# Patient Record
Sex: Female | Born: 1945 | Race: White | Hispanic: No | Smoking: Former smoker
Health system: Southern US, Community
[De-identification: ages and names within clinical notes are randomized; demographics above are authoritative.]

## PROBLEM LIST (undated history)

## (undated) DIAGNOSIS — J309 Allergic rhinitis, unspecified: Secondary | ICD-10-CM

## (undated) DIAGNOSIS — K219 Gastro-esophageal reflux disease without esophagitis: Secondary | ICD-10-CM

## (undated) DIAGNOSIS — K449 Diaphragmatic hernia without obstruction or gangrene: Secondary | ICD-10-CM

## (undated) DIAGNOSIS — H269 Unspecified cataract: Secondary | ICD-10-CM

## (undated) DIAGNOSIS — E785 Hyperlipidemia, unspecified: Secondary | ICD-10-CM

## (undated) DIAGNOSIS — M5030 Other cervical disc degeneration, unspecified cervical region: Secondary | ICD-10-CM

## (undated) HISTORY — DX: Allergic rhinitis, unspecified: J30.9

## (undated) HISTORY — PX: ESOPHAGOGASTRODUODENOSCOPY: SHX1529

## (undated) HISTORY — DX: Hyperlipidemia, unspecified: E78.5

## (undated) HISTORY — PX: OTHER SURGICAL HISTORY: SHX169

## (undated) HISTORY — DX: Other cervical disc degeneration, unspecified cervical region: M50.30

## (undated) HISTORY — DX: Gastro-esophageal reflux disease without esophagitis: K21.9

## (undated) HISTORY — PX: ABDOMINAL HYSTERECTOMY: SHX81

## (undated) HISTORY — DX: Diaphragmatic hernia without obstruction or gangrene: K44.9

## (undated) HISTORY — PX: SHOULDER SURGERY: SHX246

## (undated) HISTORY — DX: Unspecified cataract: H26.9

---

## 2000-02-17 ENCOUNTER — Ambulatory Visit (HOSPITAL_COMMUNITY): Admission: RE | Admit: 2000-02-17 | Discharge: 2000-02-17 | Payer: Self-pay | Admitting: *Deleted

## 2000-02-17 ENCOUNTER — Encounter: Payer: Self-pay | Admitting: *Deleted

## 2000-05-08 ENCOUNTER — Ambulatory Visit (HOSPITAL_COMMUNITY): Admission: RE | Admit: 2000-05-08 | Discharge: 2000-05-08 | Payer: Self-pay | Admitting: *Deleted

## 2001-08-15 ENCOUNTER — Other Ambulatory Visit: Admission: RE | Admit: 2001-08-15 | Discharge: 2001-08-15 | Payer: Self-pay | Admitting: Obstetrics and Gynecology

## 2002-07-30 ENCOUNTER — Encounter: Payer: Self-pay | Admitting: Internal Medicine

## 2002-07-30 ENCOUNTER — Encounter: Admission: RE | Admit: 2002-07-30 | Discharge: 2002-07-30 | Payer: Self-pay | Admitting: Internal Medicine

## 2005-11-23 ENCOUNTER — Ambulatory Visit: Payer: Self-pay | Admitting: Internal Medicine

## 2005-11-28 HISTORY — PX: COLONOSCOPY: SHX174

## 2005-12-02 ENCOUNTER — Ambulatory Visit: Payer: Self-pay | Admitting: Internal Medicine

## 2011-12-14 DIAGNOSIS — J301 Allergic rhinitis due to pollen: Secondary | ICD-10-CM | POA: Diagnosis not present

## 2011-12-14 DIAGNOSIS — Z23 Encounter for immunization: Secondary | ICD-10-CM | POA: Diagnosis not present

## 2011-12-14 DIAGNOSIS — E785 Hyperlipidemia, unspecified: Secondary | ICD-10-CM | POA: Diagnosis not present

## 2011-12-14 DIAGNOSIS — K649 Unspecified hemorrhoids: Secondary | ICD-10-CM | POA: Diagnosis not present

## 2011-12-14 DIAGNOSIS — K219 Gastro-esophageal reflux disease without esophagitis: Secondary | ICD-10-CM | POA: Diagnosis not present

## 2011-12-15 DIAGNOSIS — E785 Hyperlipidemia, unspecified: Secondary | ICD-10-CM | POA: Diagnosis not present

## 2011-12-26 DIAGNOSIS — R928 Other abnormal and inconclusive findings on diagnostic imaging of breast: Secondary | ICD-10-CM | POA: Diagnosis not present

## 2012-01-02 ENCOUNTER — Other Ambulatory Visit: Payer: Self-pay

## 2012-01-02 DIAGNOSIS — R928 Other abnormal and inconclusive findings on diagnostic imaging of breast: Secondary | ICD-10-CM | POA: Diagnosis not present

## 2012-01-02 DIAGNOSIS — D249 Benign neoplasm of unspecified breast: Secondary | ICD-10-CM | POA: Diagnosis not present

## 2012-01-02 DIAGNOSIS — Z0189 Encounter for other specified special examinations: Secondary | ICD-10-CM | POA: Diagnosis not present

## 2012-04-02 DIAGNOSIS — D3130 Benign neoplasm of unspecified choroid: Secondary | ICD-10-CM | POA: Diagnosis not present

## 2012-04-02 DIAGNOSIS — H5010 Unspecified exotropia: Secondary | ICD-10-CM | POA: Diagnosis not present

## 2012-07-02 DIAGNOSIS — D249 Benign neoplasm of unspecified breast: Secondary | ICD-10-CM | POA: Diagnosis not present

## 2012-07-02 DIAGNOSIS — Z09 Encounter for follow-up examination after completed treatment for conditions other than malignant neoplasm: Secondary | ICD-10-CM | POA: Diagnosis not present

## 2012-09-10 DIAGNOSIS — Z23 Encounter for immunization: Secondary | ICD-10-CM | POA: Diagnosis not present

## 2012-12-31 DIAGNOSIS — Z1231 Encounter for screening mammogram for malignant neoplasm of breast: Secondary | ICD-10-CM | POA: Diagnosis not present

## 2013-01-22 DIAGNOSIS — E785 Hyperlipidemia, unspecified: Secondary | ICD-10-CM | POA: Diagnosis not present

## 2013-04-03 DIAGNOSIS — H2510 Age-related nuclear cataract, unspecified eye: Secondary | ICD-10-CM | POA: Diagnosis not present

## 2013-04-03 DIAGNOSIS — D3130 Benign neoplasm of unspecified choroid: Secondary | ICD-10-CM | POA: Diagnosis not present

## 2013-09-03 DIAGNOSIS — Z23 Encounter for immunization: Secondary | ICD-10-CM | POA: Diagnosis not present

## 2014-01-02 DIAGNOSIS — Z1231 Encounter for screening mammogram for malignant neoplasm of breast: Secondary | ICD-10-CM | POA: Diagnosis not present

## 2014-02-03 DIAGNOSIS — K219 Gastro-esophageal reflux disease without esophagitis: Secondary | ICD-10-CM | POA: Diagnosis not present

## 2014-02-03 DIAGNOSIS — IMO0002 Reserved for concepts with insufficient information to code with codable children: Secondary | ICD-10-CM | POA: Diagnosis not present

## 2014-02-03 DIAGNOSIS — E785 Hyperlipidemia, unspecified: Secondary | ICD-10-CM | POA: Diagnosis not present

## 2014-02-03 DIAGNOSIS — M1990 Unspecified osteoarthritis, unspecified site: Secondary | ICD-10-CM | POA: Diagnosis not present

## 2014-02-03 DIAGNOSIS — J301 Allergic rhinitis due to pollen: Secondary | ICD-10-CM | POA: Diagnosis not present

## 2014-02-03 DIAGNOSIS — Z1331 Encounter for screening for depression: Secondary | ICD-10-CM | POA: Diagnosis not present

## 2014-03-31 DIAGNOSIS — H5052 Exophoria: Secondary | ICD-10-CM | POA: Diagnosis not present

## 2014-03-31 DIAGNOSIS — D3130 Benign neoplasm of unspecified choroid: Secondary | ICD-10-CM | POA: Diagnosis not present

## 2014-03-31 DIAGNOSIS — H2589 Other age-related cataract: Secondary | ICD-10-CM | POA: Diagnosis not present

## 2014-06-13 DIAGNOSIS — M79609 Pain in unspecified limb: Secondary | ICD-10-CM | POA: Diagnosis not present

## 2014-07-02 ENCOUNTER — Ambulatory Visit (INDEPENDENT_AMBULATORY_CARE_PROVIDER_SITE_OTHER): Payer: Medicare Other | Admitting: Podiatrist

## 2014-07-02 ENCOUNTER — Encounter: Payer: Self-pay | Admitting: Podiatrist

## 2014-07-02 ENCOUNTER — Ambulatory Visit (INDEPENDENT_AMBULATORY_CARE_PROVIDER_SITE_OTHER): Payer: Medicare Other

## 2014-07-02 VITALS — BP 154/85 | HR 75 | Resp 18

## 2014-07-02 DIAGNOSIS — S93609A Unspecified sprain of unspecified foot, initial encounter: Secondary | ICD-10-CM | POA: Diagnosis not present

## 2014-07-02 DIAGNOSIS — R52 Pain, unspecified: Secondary | ICD-10-CM

## 2014-07-02 NOTE — Progress Notes (Signed)
   Subjective:    Patient ID: Andrea Carroll, female    DOB: December 03, 1945, 68 y.o.   MRN: [id number]  HPI MY LEFT FOOT HAS BEEN GOING ON FOR ABOUT A MONTH AND HURTS ON THE SIDE AND A KNOT AND SHOOTING PAINS AND BURNS AND THROBS AND HURTS MORE AT NIGHT    Review of Systems  All other systems reviewed and are negative.      Objective:   Physical Exam  Patient is awake, alert, and oriented x 3.  In no acute distress.  Vascular status is intact with palpable pedal pulses at 2/4 DP and PT bilateral and capillary refill time within normal limits. Neurological sensation is also intact bilaterally via Semmes Weinstein monofilament at 5/5 sites. Light touch, vibratory sensation, Achilles tendon reflex is intact. Dermatological exam reveals skin color, turger and texture as normal. No open lesions present.  Musculature intact with dorsiflexion, plantarflexion, inversion, eversion.  Mild discomfort on the lateral aspect the left foot is noted. At the fifth metatarsal base. No pain along the peroneal tendons is present. X-rays are negative for fracture.     Assessment & Plan:  Sprain left foot  Plan: Recommended rest, ice, compression. Discussed putting her in a boot should the discomfort not resolve with compression wraps. The information on foot sprains was dispensed.  If her foot continues to give her trouble she will call and I will dispense a boot. Otherwise she'll be seen back as needed.

## 2014-07-02 NOTE — Patient Instructions (Signed)
Foot Sprain The muscles and cord like structures which attach muscle to bone (tendons) that surround the feet are made up of units. A foot sprain can occur at the weakest spot in any of these units. This condition is most often caused by injury to or overuse of the foot, as from playing contact sports, or aggravating a previous injury, or from poor conditioning, or obesity. SYMPTOMS  Pain with movement of the foot.  Tenderness and swelling at the injury site.  Loss of strength is present in moderate or severe sprains. THE THREE GRADES OR SEVERITY OF FOOT SPRAIN ARE:  Mild (Grade I): Slightly pulled muscle without tearing of muscle or tendon fibers or loss of strength.  Moderate (Grade II): Tearing of fibers in a muscle, tendon, or at the attachment to bone, with small decrease in strength.  Severe (Grade III): Rupture of the muscle-tendon-bone attachment, with separation of fibers. Severe sprain requires surgical repair. Often repeating (chronic) sprains are caused by overuse. Sudden (acute) sprains are caused by direct injury or over-use. DIAGNOSIS  Diagnosis of this condition is usually by your own observation. If problems continue, a caregiver may be required for further evaluation and treatment. X-rays may be required to make sure there are not breaks in the bones (fractures) present. Continued problems may require physical therapy for treatment. PREVENTION  Use strength and conditioning exercises appropriate for your sport.  Warm up properly prior to working out.  Use athletic shoes that are made for the sport you are participating in.  Allow adequate time for healing. Early return to activities makes repeat injury more likely, and can lead to an unstable arthritic foot that can result in prolonged disability. Mild sprains generally heal in 3 to 10 days, with moderate and severe sprains taking 2 to 10 weeks. Your caregiver can help you determine the proper time required for  healing. HOME CARE INSTRUCTIONS   Apply ice to the injury for 15-20 minutes, 03-04 times per day. Put the ice in a plastic bag and place a towel between the bag of ice and your skin.  An elastic wrap (like an Ace bandage) may be used to keep swelling down.  Keep foot above the level of the heart, or at least raised on a footstool, when swelling and pain are present.  Try to avoid use other than gentle range of motion while the foot is painful. Do not resume use until instructed by your caregiver. Then begin use gradually, not increasing use to the point of pain. If pain does develop, decrease use and continue the above measures, gradually increasing activities that do not cause discomfort, until you gradually achieve normal use.  Use crutches if and as instructed, and for the length of time instructed.  Keep injured foot and ankle wrapped between treatments.  Massage foot and ankle for comfort and to keep swelling down. Massage from the toes up towards the knee.  Only take over-the-counter or prescription medicines for pain, discomfort, or fever as directed by your caregiver. SEEK IMMEDIATE MEDICAL CARE IF:   Your pain and swelling increase, or pain is not controlled with medications.  You have loss of feeling in your foot or your foot turns cold or blue.  You develop new, unexplained symptoms, or an increase of the symptoms that brought you to your caregiver. MAKE SURE YOU:   Understand these instructions.  Will watch your condition.  Will get help right away if you are not doing well or get worse. Document Released:   05/06/2002 Document Revised: 02/06/2012 Document Reviewed: 07/03/2008 Exit[employer] Patient Information 2015 [employer]. This information is not intended to replace advice given to you by your health care provider. Make sure you discuss any questions you have with your health care provider.  

## 2014-09-01 DIAGNOSIS — Z23 Encounter for immunization: Secondary | ICD-10-CM | POA: Diagnosis not present

## 2014-09-24 ENCOUNTER — Encounter: Payer: Self-pay | Admitting: Podiatrist

## 2014-09-24 ENCOUNTER — Ambulatory Visit (INDEPENDENT_AMBULATORY_CARE_PROVIDER_SITE_OTHER): Payer: Medicare Other | Admitting: Podiatrist

## 2014-09-24 VITALS — BP 139/64 | HR 87 | Resp 12

## 2014-09-24 DIAGNOSIS — M6588 Other synovitis and tenosynovitis, other site: Secondary | ICD-10-CM | POA: Diagnosis not present

## 2014-09-24 DIAGNOSIS — M71572 Other bursitis, not elsewhere classified, left ankle and foot: Secondary | ICD-10-CM | POA: Diagnosis not present

## 2014-09-24 DIAGNOSIS — M7750 Other enthesopathy of unspecified foot: Secondary | ICD-10-CM

## 2014-09-24 DIAGNOSIS — M7752 Other enthesopathy of left foot: Secondary | ICD-10-CM

## 2014-09-24 MED ORDER — TRIAMCINOLONE ACETONIDE 10 MG/ML IJ SUSP
10.0000 mg | Freq: Once | INTRAMUSCULAR | Status: AC
  Administered 2014-09-24: 10 mg

## 2014-09-26 NOTE — Progress Notes (Signed)
Chief Complaint  Patient presents with  . Foot Pain    ''LT OUTSIDE OF THE FOOT IS ACHING AND THROBBING.''     HPI: Patient is 68 y.o. female who presents today for pain on the lateral side of the left foot at the fifth metatarsal base region. She states that the pain goes under her foot and onto the medial aspect of the foot plantarly. Any trauma or injury to the foot.  Physical Exam  Patient is awake, alert, and oriented x 3.  In no acute distress.  Vascular status is intact with palpable pedal pulses at 2/4 DP and PT bilateral and capillary refill time within normal limits. Neurological sensation is also intact bilaterally via Semmes Weinstein monofilament at 5/5 sites. Light touch, vibratory sensation, Achilles tendon reflex is intact. Dermatological exam reveals skin color, turger and texture as normal. No open lesions present.  Musculature intact with dorsiflexion, plantarflexion, inversion, eversion. Pain along the lateral aspect of the foot at the fifth metatarsal base and plantarly along the course of the peroneus longus tendon is noted. Proximal pain along the course of the peroneus longus tendon is also noted. Swelling at the base of the fifth metatarsal is also noted.  Assessment: Bursitis, tendinitis left foot  Plan: Recommended an injection into the area of maximal tenderness and the patient agreed. Dexamethasone and Marcaine plain were infiltrated into the area of maximal tenderness. A plantar fascial strapping was then applied and she was given instructions for decreasing activity and for aftercare. Discussed the long-term positive effects of orthotics. I will see her back for recheck and we'll discuss further.

## 2014-10-21 DIAGNOSIS — H18822 Corneal disorder due to contact lens, left eye: Secondary | ICD-10-CM | POA: Diagnosis not present

## 2015-01-05 ENCOUNTER — Emergency Department (HOSPITAL_COMMUNITY): Payer: Medicare Other

## 2015-01-05 ENCOUNTER — Inpatient Hospital Stay (HOSPITAL_COMMUNITY)
Admission: EM | Admit: 2015-01-05 | Discharge: 2015-01-09 | DRG: 418 | Disposition: A | Discharge status: Home / Self Care | Payer: Medicare Other | Attending: Surgery | Admitting: Surgery

## 2015-01-05 ENCOUNTER — Encounter (HOSPITAL_COMMUNITY): Payer: Self-pay | Admitting: Family Medicine

## 2015-01-05 DIAGNOSIS — K219 Gastro-esophageal reflux disease without esophagitis: Secondary | ICD-10-CM | POA: Diagnosis not present

## 2015-01-05 DIAGNOSIS — E876 Hypokalemia: Secondary | ICD-10-CM | POA: Diagnosis not present

## 2015-01-05 DIAGNOSIS — K828 Other specified diseases of gallbladder: Secondary | ICD-10-CM | POA: Diagnosis not present

## 2015-01-05 DIAGNOSIS — K8000 Calculus of gallbladder with acute cholecystitis without obstruction: Secondary | ICD-10-CM | POA: Diagnosis present

## 2015-01-05 DIAGNOSIS — K8020 Calculus of gallbladder without cholecystitis without obstruction: Secondary | ICD-10-CM | POA: Diagnosis not present

## 2015-01-05 DIAGNOSIS — Z90710 Acquired absence of both cervix and uterus: Secondary | ICD-10-CM

## 2015-01-05 DIAGNOSIS — R05 Cough: Secondary | ICD-10-CM

## 2015-01-05 DIAGNOSIS — K8010 Calculus of gallbladder with chronic cholecystitis without obstruction: Secondary | ICD-10-CM | POA: Diagnosis present

## 2015-01-05 DIAGNOSIS — R7989 Other specified abnormal findings of blood chemistry: Secondary | ICD-10-CM | POA: Diagnosis not present

## 2015-01-05 DIAGNOSIS — Z882 Allergy status to sulfonamides status: Secondary | ICD-10-CM | POA: Diagnosis not present

## 2015-01-05 DIAGNOSIS — K8060 Calculus of gallbladder and bile duct with cholecystitis, unspecified, without obstruction: Secondary | ICD-10-CM | POA: Diagnosis not present

## 2015-01-05 DIAGNOSIS — R945 Abnormal results of liver function studies: Secondary | ICD-10-CM

## 2015-01-05 DIAGNOSIS — R10811 Right upper quadrant abdominal tenderness: Secondary | ICD-10-CM | POA: Diagnosis not present

## 2015-01-05 DIAGNOSIS — R059 Cough, unspecified: Secondary | ICD-10-CM

## 2015-01-05 DIAGNOSIS — R202 Paresthesia of skin: Secondary | ICD-10-CM | POA: Diagnosis not present

## 2015-01-05 DIAGNOSIS — K810 Acute cholecystitis: Secondary | ICD-10-CM

## 2015-01-05 DIAGNOSIS — N390 Urinary tract infection, site not specified: Secondary | ICD-10-CM | POA: Diagnosis present

## 2015-01-05 DIAGNOSIS — Z7982 Long term (current) use of aspirin: Secondary | ICD-10-CM

## 2015-01-05 DIAGNOSIS — F419 Anxiety disorder, unspecified: Secondary | ICD-10-CM | POA: Diagnosis present

## 2015-01-05 DIAGNOSIS — I6789 Other cerebrovascular disease: Secondary | ICD-10-CM | POA: Diagnosis not present

## 2015-01-05 DIAGNOSIS — R509 Fever, unspecified: Secondary | ICD-10-CM | POA: Diagnosis not present

## 2015-01-05 DIAGNOSIS — R1011 Right upper quadrant pain: Secondary | ICD-10-CM | POA: Diagnosis not present

## 2015-01-05 LAB — COMPREHENSIVE METABOLIC PANEL
ALT: 73 U/L — AB (ref 0–35)
ANION GAP: 11 (ref 5–15)
AST: 63 U/L — ABNORMAL HIGH (ref 0–37)
Albumin: 2.6 g/dL — ABNORMAL LOW (ref 3.5–5.2)
Alkaline Phosphatase: 304 U/L — ABNORMAL HIGH (ref 39–117)
BUN: 9 mg/dL (ref 6–23)
CHLORIDE: 103 mmol/L (ref 96–112)
CO2: 27 mmol/L (ref 19–32)
Calcium: 8.7 mg/dL (ref 8.4–10.5)
Creatinine, Ser: 0.64 mg/dL (ref 0.50–1.10)
GFR calc Af Amer: >90 mL/min (ref >90)
GFR calc non Af Amer: 88 mL/min — ABNORMAL LOW (ref >90)
Glucose, Bld: 108 mg/dL — ABNORMAL HIGH (ref 70–99)
Potassium: 2.9 mmol/L — ABNORMAL LOW (ref 3.5–5.1)
SODIUM: 141 mmol/L (ref 135–145)
Total Bilirubin: 1.5 mg/dL — ABNORMAL HIGH (ref 0.3–1.2)
Total Protein: 6.6 g/dL (ref 6.0–8.3)

## 2015-01-05 LAB — CBC WITH DIFFERENTIAL/PLATELET
BASOS ABS: 0 10*3/uL (ref 0.0–0.1)
Basophils Relative: 0 % (ref 0–1)
EOS PCT: 1 % (ref 0–5)
Eosinophils Absolute: 0 10*3/uL (ref 0.0–0.7)
HEMATOCRIT: 34.7 % — AB (ref 36.0–46.0)
HEMOGLOBIN: 11.6 g/dL — AB (ref 12.0–15.0)
Lymphocytes Relative: 16 % (ref 12–46)
Lymphs Abs: 1.3 10*3/uL (ref 0.7–4.0)
MCH: 29.2 pg (ref 26.0–34.0)
MCHC: 33.4 g/dL (ref 30.0–36.0)
MCV: 87.4 fL (ref 78.0–100.0)
MONO ABS: 1.1 10*3/uL — AB (ref 0.1–1.0)
Monocytes Relative: 13 % — ABNORMAL HIGH (ref 3–12)
NEUTROS ABS: 5.5 10*3/uL (ref 1.7–7.7)
NEUTROS PCT: 70 % (ref 43–77)
PLATELETS: 257 10*3/uL (ref 150–400)
RBC: 3.97 MIL/uL (ref 3.87–5.11)
RDW: 13.6 % (ref 11.5–15.5)
WBC: 7.9 10*3/uL (ref 4.0–10.5)

## 2015-01-05 LAB — URINE MICROSCOPIC-ADD ON

## 2015-01-05 LAB — URINALYSIS, ROUTINE W REFLEX MICROSCOPIC
Bilirubin Urine: NEGATIVE
GLUCOSE, UA: NEGATIVE mg/dL
Ketones, ur: 15 mg/dL — AB
Nitrite: NEGATIVE
PH: 7 (ref 5.0–8.0)
Protein, ur: NEGATIVE mg/dL
SPECIFIC GRAVITY, URINE: 1.006 (ref 1.005–1.030)
Urobilinogen, UA: 1 mg/dL (ref 0.0–1.0)

## 2015-01-05 LAB — LIPASE, BLOOD: Lipase: 23 U/L (ref 11–59)

## 2015-01-05 MED ORDER — MORPHINE SULFATE 2 MG/ML IJ SOLN
2.0000 mg | INTRAMUSCULAR | Status: DC | PRN
  Administered 2015-01-06: 4 mg via INTRAVENOUS
  Administered 2015-01-06: 2 mg via INTRAVENOUS
  Filled 2015-01-05: qty 1
  Filled 2015-01-05: qty 2

## 2015-01-05 MED ORDER — ACETAMINOPHEN 325 MG PO TABS
650.0000 mg | ORAL_TABLET | Freq: Four times a day (QID) | ORAL | Status: DC | PRN
  Administered 2015-01-07: 650 mg via ORAL
  Filled 2015-01-05: qty 2

## 2015-01-05 MED ORDER — CEFTRIAXONE SODIUM 1 G IJ SOLR
1.0000 g | Freq: Once | INTRAMUSCULAR | Status: AC
  Administered 2015-01-05: 1 g via INTRAVENOUS
  Filled 2015-01-05: qty 10

## 2015-01-05 MED ORDER — WHITE PETROLATUM GEL
Status: AC
Start: 2015-01-05 — End: 2015-01-06
  Filled 2015-01-05: qty 1

## 2015-01-05 MED ORDER — SODIUM CHLORIDE 0.9 % IV BOLUS (SEPSIS)
1000.0000 mL | Freq: Once | INTRAVENOUS | Status: AC
  Administered 2015-01-05: 1000 mL via INTRAVENOUS

## 2015-01-05 MED ORDER — PANTOPRAZOLE SODIUM 40 MG IV SOLR
40.0000 mg | Freq: Every day | INTRAVENOUS | Status: DC
  Administered 2015-01-05 – 2015-01-07 (×3): 40 mg via INTRAVENOUS
  Filled 2015-01-05 (×3): qty 40

## 2015-01-05 MED ORDER — ACETAMINOPHEN 650 MG RE SUPP: 650.0000 mg | Freq: Four times a day (QID) | RECTAL | Status: DC | PRN

## 2015-01-05 MED ORDER — KCL IN DEXTROSE-NACL 20-5-0.45 MEQ/L-%-% IV SOLN
INTRAVENOUS | Status: DC
  Administered 2015-01-05 – 2015-01-07 (×3): via INTRAVENOUS
  Filled 2015-01-05 (×8): qty 1000

## 2015-01-05 MED ORDER — ONDANSETRON HCL 4 MG/2ML IJ SOLN: 4.0000 mg | Freq: Four times a day (QID) | INTRAMUSCULAR | Status: DC | PRN

## 2015-01-05 MED ORDER — POTASSIUM CHLORIDE 10 MEQ/100ML IV SOLN
10.0000 meq | Freq: Once | INTRAVENOUS | Status: AC
  Administered 2015-01-05: 10 meq via INTRAVENOUS
  Filled 2015-01-05: qty 100

## 2015-01-05 MED ORDER — CEFTRIAXONE SODIUM IN DEXTROSE 40 MG/ML IV SOLN
2.0000 g | INTRAVENOUS | Status: DC
  Administered 2015-01-06: 2 g via INTRAVENOUS
  Filled 2015-01-05 (×2): qty 50

## 2015-01-05 NOTE — ED Notes (Signed)
Pt here for chills,non productive cough and anxiety attack.

## 2015-01-05 NOTE — ED Provider Notes (Signed)
CSN: [id number]     Arrival date & time 01/05/15  1127 History   First MD Initiated Contact with Patient 01/05/15 1609     Chief Complaint  Patient presents with  . Anxiety  . Chills  . Fever     (Consider location/radiation/quality/duration/timing/severity/associated sxs/prior Treatment) The history is provided by the patient.     Patient presents with one week of shaking chills, fever to 101.9, headache, ear pain, nausea and dry heaves, anorexia, occasional SOB and pain under her ribs with deep inspiration.  Was prescribed Tamiflu over the phone by her PCP Dr Dagmar Hait.  Felt she was intermittently getting better, then worse.  Today felt "rubbery" and weak all over, developed numbness in her ears and hands with SOB - told by fire department she was having a panic attack.  Denies cough, sore throat, vomiting, diarrhea, nasal congestion/ rhinorrhea, urinary symptoms, abdominal pain.   History reviewed. No pertinent past medical history. Past Surgical History  Procedure Laterality Date  . Biospy surgery      breast   History reviewed. No pertinent family history. History  Substance Use Topics  . Smoking status: Never Smoker   . Smokeless tobacco: Never Used  . Alcohol Use: Not on file   OB History    No data available     Review of Systems  All other systems reviewed and are negative.     Allergies  Sulfa antibiotics  Home Medications   Prior to Admission medications   Medication Sig Start Date End Date Taking? Authorizing Provider  aspirin 81 MG tablet Take 81 mg by mouth daily.    Historical Provider, MD  calcium carbonate (OS-CAL) 600 MG TABS tablet Take 600 mg by mouth 2 (two) times daily with a meal.    Historical Provider, MD  DETROL LA 4 MG 24 hr capsule  04/23/14   Historical Provider, MD  Glucosamine-Chondroit-Vit C-Mn (GLUCOSAMINE 1500 COMPLEX) CAPS Take by mouth.    Historical Provider, MD  lansoprazole (PREVACID) 30 MG capsule  05/31/14   Historical Provider, MD   Multiple Vitamin (MULTI VITAMIN DAILY PO) Take by mouth.    Historical Provider, MD  Omega-3 Fatty Acids (FISH OIL) 1000 MG CAPS Take by mouth.    Historical Provider, MD  Vitamin D, Ergocalciferol, (DRISDOL) 50000 UNITS CAPS capsule Take 50,000 Units by mouth every 7 (seven) days.    Historical Provider, MD   BP 135/71 mmHg  Pulse 94  Temp(Src) 97.7 F (36.5 C) (Oral)  Resp 18  Wt 144 lb (65.318 kg)  SpO2 96% Physical Exam  Constitutional: She appears well-developed and well-nourished. No distress.  HENT:  Head: Normocephalic and atraumatic.  Neck: Neck supple.  Cardiovascular: Normal rate and regular rhythm.   Pulmonary/Chest: Effort normal and breath sounds normal. No respiratory distress. She has no wheezes. She has no rales.  Abdominal: Soft. She exhibits no distension. There is tenderness in the right upper quadrant. There is no rebound and no guarding.  Neurological: She is alert.  Skin: She is not diaphoretic.  Nursing note and vitals reviewed.   ED Course  Procedures (including critical care time) Labs Review Labs Reviewed  CBC WITH DIFFERENTIAL/PLATELET - Abnormal; Notable for the following:    Hemoglobin 11.6 (*)    HCT 34.7 (*)    Monocytes Relative 13 (*)    Monocytes Absolute 1.1 (*)    All other components within normal limits  COMPREHENSIVE METABOLIC PANEL - Abnormal; Notable for the following:    Potassium  2.9 (*)    Glucose, Bld 108 (*)    Albumin 2.6 (*)    AST 63 (*)    ALT 73 (*)    Alkaline Phosphatase 304 (*)    Total Bilirubin 1.5 (*)    GFR calc non Af Amer 88 (*)    All other components within normal limits  URINALYSIS, ROUTINE W REFLEX MICROSCOPIC - Abnormal; Notable for the following:    APPearance CLOUDY (*)    Hgb urine dipstick SMALL (*)    Ketones, ur 15 (*)    Leukocytes, UA LARGE (*)    All other components within normal limits  URINE MICROSCOPIC-ADD ON - Abnormal; Notable for the following:    Squamous Epithelial / LPF FEW (*)     Bacteria, UA MANY (*)    All other components within normal limits  URINE CULTURE  LIPASE, BLOOD    Imaging Review Dg Chest 2 View  01/05/2015   CLINICAL DATA:  Fever and cough.  EXAM: CHEST  2 VIEW  COMPARISON:  None.  FINDINGS: Heart size and mediastinal contours are within normal limits. Both lungs are clear. Visualized skeletal structures are unremarkable.  IMPRESSION: Negative exam.   Electronically Signed   By: Inge Rise M.D.   On: 01/05/2015 12:55   US Abdomen Complete  01/05/2015   CLINICAL DATA:  Right upper quadrant tenderness and elevated liver function tests.  EXAM: ULTRASOUND ABDOMEN COMPLETE  COMPARISON:  None.  FINDINGS: Gallbladder: Cholelithiasis with large stone in the gallbladder neck measuring about 2.1 cm diameter an additional smaller stones and sludge in the remainder of the gallbladder. Diffuse gallbladder wall thickening measuring up to 4.5 mm. Murphy's sign is negative.  Common bile duct: Diameter: 4.5 mm, normal  Liver: No focal lesion identified. Within normal limits in parenchymal echogenicity.  IVC: No abnormality visualized.  Pancreas: Visualized portion unremarkable.  Spleen: Size and appearance within normal limits.  Right Kidney: Length: 10.4 cm. Echogenicity within normal limits. No mass or hydronephrosis visualized.  Left Kidney: Length: 11.1 cm. Echogenicity within normal limits. No mass or hydronephrosis visualized.  Abdominal aorta: No aneurysm visualized.  Other findings: None.  IMPRESSION: Abnormal gallbladder with gallbladder wall thickening, cholelithiasis, and sludge. Murphy's sign is negative. Findings are nonspecific but may indicate acute or chronic cholecystitis. No bile duct dilatation.   Electronically Signed   By: Lucienne Capers M.D.   On: 01/05/2015 18:49     EKG Interpretation None       6:01 PM Discussed pt with Dr Ralene Bathe.   MDM   Final diagnoses:  RUQ abdominal tenderness  LFT elevation  Acute cholecystitis  UTI (lower urinary  tract infection)    Afebrile nontoxic patient with one week of fever/chills, myalgias, found to have UTI, culture pending, rocephin given, and elevated LFTs.  [place] abd shows abnormal gallbladder with wall thickening, cholelithiasis with large stone in gallbladder neck.   CXR negative.  Labs otherwise remarkable for anemia.  Discussed pt with Dr Grandville Silos, general surgery, who has seen the patient in the ED and admitted to the hospital for surgery.      Clayton Bibles, PA-C 01/05/15 2232  Quintella Reichert, MD 01/06/15 [phone]

## 2015-01-05 NOTE — Progress Notes (Signed)
Tct Emergency department  to get report/ nurse will call me back

## 2015-01-05 NOTE — H&P (Signed)
Andrea Carroll is an 69 y.o. female.   Chief Complaint: Fever, malaise, right upper quadrant pain HPI: Andrea Carroll complains of a 7 day history of malaise. She had subjective fevers at home. She thought she had the flu and was given a prescription for Tamiflu by her primary care physician. She had a reaction to that medication which included tachycardia so she stopped taking it. She continued to feel poorly and had an appointment for later today, however, she acutely felt worse and came via EMS to the emergency department. Evaluation here included laboratory studies showing likely urinary tract infection, hypokalemia, and elevated liver function tests. Abdominal ultrasound demonstrates cholelithiasis with a stone in the neck of the gallbladder and some gallbladder wall thickening. I was asked to evaluate her for surgical treatment. She has received potassium replacement in the emergency department. Her RUQ pain is currently mild.  History reviewed. No pertinent past medical history.  Past Surgical History  Procedure Laterality Date  . Biospy surgery      breast  . Abdominal hysterectomy    . Shoulder surgery Left     History reviewed. No pertinent family history. Social History:  reports that she has never smoked. She has never used smokeless tobacco. She reports that she does not use illicit drugs. Her alcohol history is not on file.  Allergies:  Allergies  Allergen Reactions  . Sulfa Antibiotics     rash     (Not in a hospital admission)  Results for orders placed or performed during the hospital encounter of 01/05/15 (from the past 48 hour(s))  CBC with Differential     Status: Abnormal   Collection Time: 01/05/15 11:43 AM  Result Value Ref Range   WBC 7.9 4.0 - 10.5 K/uL   RBC 3.97 3.87 - 5.11 MIL/uL   Hemoglobin 11.6 (L) 12.0 - 15.0 g/dL   HCT 34.7 (L) 36.0 - 46.0 %   MCV 87.4 78.0 - 100.0 fL   MCH 29.2 26.0 - 34.0 pg   MCHC 33.4 30.0 - 36.0 g/dL   RDW 13.6 11.5 - 15.5 %    Platelets 257 150 - 400 K/uL   Neutrophils Relative % 70 43 - 77 %   Neutro Abs 5.5 1.7 - 7.7 K/uL   Lymphocytes Relative 16 12 - 46 %   Lymphs Abs 1.3 0.7 - 4.0 K/uL   Monocytes Relative 13 (H) 3 - 12 %   Monocytes Absolute 1.1 (H) 0.1 - 1.0 K/uL   Eosinophils Relative 1 0 - 5 %   Eosinophils Absolute 0.0 0.0 - 0.7 K/uL   Basophils Relative 0 0 - 1 %   Basophils Absolute 0.0 0.0 - 0.1 K/uL  Comprehensive metabolic panel     Status: Abnormal   Collection Time: 01/05/15 11:43 AM  Result Value Ref Range   Sodium 141 135 - 145 mmol/L   Potassium 2.9 (L) 3.5 - 5.1 mmol/L   Chloride 103 96 - 112 mmol/L   CO2 27 19 - 32 mmol/L   Glucose, Bld 108 (H) 70 - 99 mg/dL   BUN 9 6 - 23 mg/dL   Creatinine, Ser 0.64 0.50 - 1.10 mg/dL   Calcium 8.7 8.4 - 10.5 mg/dL   Total Protein 6.6 6.0 - 8.3 g/dL   Albumin 2.6 (L) 3.5 - 5.2 g/dL   AST 63 (H) 0 - 37 U/L   ALT 73 (H) 0 - 35 U/L   Alkaline Phosphatase 304 (H) 39 - 117 U/L   Total  Bilirubin 1.5 (H) 0.3 - 1.2 mg/dL   GFR calc non Af Amer 88 (L) >90 mL/min   GFR calc Af Amer >90 >90 mL/min    Comment: (NOTE) The eGFR has been calculated using the CKD EPI equation. This calculation has not been validated in all clinical situations. eGFR's persistently <90 mL/min signify possible Chronic Kidney Disease.    Anion gap 11 5 - 15  Urinalysis, Routine w reflex microscopic     Status: Abnormal   Collection Time: 01/05/15 12:08 PM  Result Value Ref Range   Color, Urine YELLOW YELLOW   APPearance CLOUDY (A) CLEAR   Specific Gravity, Urine 1.006 1.005 - 1.030   pH 7.0 5.0 - 8.0   Glucose, UA NEGATIVE NEGATIVE mg/dL   Hgb urine dipstick SMALL (A) NEGATIVE   Bilirubin Urine NEGATIVE NEGATIVE   Ketones, ur 15 (A) NEGATIVE mg/dL   Protein, ur NEGATIVE NEGATIVE mg/dL   Urobilinogen, UA 1.0 0.0 - 1.0 mg/dL   Nitrite NEGATIVE NEGATIVE   Leukocytes, UA LARGE (A) NEGATIVE  Urine microscopic-add on     Status: Abnormal   Collection Time: 01/05/15 12:08  PM  Result Value Ref Range   Squamous Epithelial / LPF FEW (A) RARE   WBC, UA 21-50 <3 WBC/hpf   RBC / HPF 0-2 <3 RBC/hpf   Bacteria, UA MANY (A) RARE  Lipase, blood     Status: None   Collection Time: 01/05/15  5:45 PM  Result Value Ref Range   Lipase 23 11 - 59 U/L   Dg Chest 2 View  01/05/2015   CLINICAL DATA:  Fever and cough.  EXAM: CHEST  2 VIEW  COMPARISON:  None.  FINDINGS: Heart size and mediastinal contours are within normal limits. Both lungs are clear. Visualized skeletal structures are unremarkable.  IMPRESSION: Negative exam.   Electronically Signed   By: Inge Rise M.D.   On: 01/05/2015 12:55   US Abdomen Complete  01/05/2015   CLINICAL DATA:  Right upper quadrant tenderness and elevated liver function tests.  EXAM: ULTRASOUND ABDOMEN COMPLETE  COMPARISON:  None.  FINDINGS: Gallbladder: Cholelithiasis with large stone in the gallbladder neck measuring about 2.1 cm diameter an additional smaller stones and sludge in the remainder of the gallbladder. Diffuse gallbladder wall thickening measuring up to 4.5 mm. Murphy's sign is negative.  Common bile duct: Diameter: 4.5 mm, normal  Liver: No focal lesion identified. Within normal limits in parenchymal echogenicity.  IVC: No abnormality visualized.  Pancreas: Visualized portion unremarkable.  Spleen: Size and appearance within normal limits.  Right Kidney: Length: 10.4 cm. Echogenicity within normal limits. No mass or hydronephrosis visualized.  Left Kidney: Length: 11.1 cm. Echogenicity within normal limits. No mass or hydronephrosis visualized.  Abdominal aorta: No aneurysm visualized.  Other findings: None.  IMPRESSION: Abnormal gallbladder with gallbladder wall thickening, cholelithiasis, and sludge. Murphy's sign is negative. Findings are nonspecific but may indicate acute or chronic cholecystitis. No bile duct dilatation.   Electronically Signed   By: Lucienne Capers M.D.   On: 01/05/2015 18:49    Review of Systems   Constitutional: Positive for fever and chills.  HENT: Negative.   Eyes: Negative.   Respiratory:       Deep breaths exacerbate pain in right upper quadrant  Cardiovascular: Negative for chest pain and palpitations.  Gastrointestinal: Positive for abdominal pain. Negative for constipation and blood in stool.  Genitourinary: Negative for dysuria.  Musculoskeletal: Negative.   Skin: Negative.   Neurological: Negative.  Endo/Heme/Allergies: Negative.   Psychiatric/Behavioral:       Feels she had a panic attack earlier today    Blood pressure 148/66, pulse 88, temperature 97.7 F (36.5 C), temperature source Oral, resp. rate 18, weight 144 lb (65.318 kg), SpO2 97 %. Physical Exam  Constitutional: She is oriented to person, place, and time. She appears well-developed and well-nourished. No distress.  HENT:  Head: Normocephalic and atraumatic.  Right Ear: External ear normal.  Left Ear: External ear normal.  Nose: Nose normal.  Mouth/Throat: Oropharynx is clear and moist. No oropharyngeal exudate.  Eyes: EOM are normal. Pupils are equal, round, and reactive to light. Right eye exhibits no discharge. Left eye exhibits no discharge.  Neck: Normal range of motion. Neck supple. No tracheal deviation present.  Cardiovascular: Normal rate, normal heart sounds and intact distal pulses.   Respiratory: Effort normal and breath sounds normal. No stridor. No respiratory distress. She has no wheezes. She has no rales.  GI: Soft. She exhibits no distension. There is tenderness. There is no rebound and no guarding.  Mild tenderness right upper quadrant with deep palpation, no palpable masses, hypoactive bowel sounds  Musculoskeletal: Normal range of motion.  Lymphadenopathy:    She has no cervical adenopathy.  Neurological: She is alert and oriented to person, place, and time. She exhibits normal muscle tone.  Skin: Skin is warm and dry.  Psychiatric: She has a normal mood and affect.      Assessment/Plan Cholecystitis with cholelithiasis - admit, IV Rocephin, will plan laparoscopic cholecystectomy with also able cholangiogram this admission. Procedure, risks, and benefits were discussed in detail with her and her husband. She is agreeable. Recheck LFTs in a.m. NPO after midnight. I will discuss with my partner, Dr. Brantley Stage.  Likely urinary tract infection - this will be covered by Rocephin  Hypokalemia - replacement ordered in the ED, check labs in a.m., IV fluids with potassium  Jaquille Kau E 01/05/2015, 7:55 PM

## 2015-01-06 ENCOUNTER — Encounter (HOSPITAL_COMMUNITY): Admission: EM | Disposition: A | Discharge status: Home / Self Care | Payer: Self-pay | Source: Home / Self Care

## 2015-01-06 ENCOUNTER — Inpatient Hospital Stay (HOSPITAL_COMMUNITY): Payer: Medicare Other | Admitting: Certified Registered Nurse Anesthetist

## 2015-01-06 ENCOUNTER — Inpatient Hospital Stay (HOSPITAL_COMMUNITY): Payer: Medicare Other

## 2015-01-06 ENCOUNTER — Encounter (HOSPITAL_COMMUNITY): Payer: Self-pay | Admitting: Certified Registered Nurse Anesthetist

## 2015-01-06 HISTORY — PX: CHOLECYSTECTOMY: SHX55

## 2015-01-06 LAB — COMPREHENSIVE METABOLIC PANEL
ALK PHOS: 255 U/L — AB (ref 39–117)
ALT: 74 U/L — AB (ref 0–35)
AST: 63 U/L — ABNORMAL HIGH (ref 0–37)
Albumin: 2.5 g/dL — ABNORMAL LOW (ref 3.5–5.2)
Anion gap: 7 (ref 5–15)
BILIRUBIN TOTAL: 1 mg/dL (ref 0.3–1.2)
BUN: <5 mg/dL — ABNORMAL LOW (ref 6–23)
CALCIUM: 8.4 mg/dL (ref 8.4–10.5)
CO2: 29 mmol/L (ref 19–32)
Chloride: 106 mmol/L (ref 96–112)
Creatinine, Ser: 0.56 mg/dL (ref 0.50–1.10)
GLUCOSE: 121 mg/dL — AB (ref 70–99)
Potassium: 3.5 mmol/L (ref 3.5–5.1)
Sodium: 142 mmol/L (ref 135–145)
Total Protein: 5.8 g/dL — ABNORMAL LOW (ref 6.0–8.3)

## 2015-01-06 LAB — CBC
HCT: 32.2 % — ABNORMAL LOW (ref 36.0–46.0)
Hemoglobin: 10.6 g/dL — ABNORMAL LOW (ref 12.0–15.0)
MCH: 28.8 pg (ref 26.0–34.0)
MCHC: 32.9 g/dL (ref 30.0–36.0)
MCV: 87.5 fL (ref 78.0–100.0)
Platelets: 286 10*3/uL (ref 150–400)
RBC: 3.68 MIL/uL — ABNORMAL LOW (ref 3.87–5.11)
RDW: 14 % (ref 11.5–15.5)
WBC: 8.8 10*3/uL (ref 4.0–10.5)

## 2015-01-06 LAB — SURGICAL PCR SCREEN
MRSA, PCR: NEGATIVE
STAPHYLOCOCCUS AUREUS: NEGATIVE

## 2015-01-06 LAB — PROTIME-INR
INR: 1.09 (ref 0.00–1.49)
PROTHROMBIN TIME: 14.2 s (ref 11.6–15.2)

## 2015-01-06 SURGERY — LAPAROSCOPIC CHOLECYSTECTOMY WITH INTRAOPERATIVE CHOLANGIOGRAM
Anesthesia: General | Site: Abdomen

## 2015-01-06 MED ORDER — DEXAMETHASONE SODIUM PHOSPHATE 4 MG/ML IJ SOLN
INTRAMUSCULAR | Status: AC
  Filled 2015-01-06: qty 1

## 2015-01-06 MED ORDER — BUPIVACAINE-EPINEPHRINE 0.25% -1:200000 IJ SOLN
INTRAMUSCULAR | Status: DC | PRN
  Administered 2015-01-06: 8 mL

## 2015-01-06 MED ORDER — EPHEDRINE SULFATE 50 MG/ML IJ SOLN
INTRAMUSCULAR | Status: DC | PRN
  Administered 2015-01-06: 10 mg via INTRAVENOUS

## 2015-01-06 MED ORDER — BUPIVACAINE-EPINEPHRINE (PF) 0.25% -1:200000 IJ SOLN
INTRAMUSCULAR | Status: AC
  Filled 2015-01-06: qty 30

## 2015-01-06 MED ORDER — DEXAMETHASONE SODIUM PHOSPHATE 4 MG/ML IJ SOLN
INTRAMUSCULAR | Status: DC | PRN
  Administered 2015-01-06: 4 mg via INTRAVENOUS

## 2015-01-06 MED ORDER — GLYCOPYRROLATE 0.2 MG/ML IJ SOLN
INTRAMUSCULAR | Status: AC
  Filled 2015-01-06: qty 8

## 2015-01-06 MED ORDER — SODIUM CHLORIDE 0.9 % IR SOLN
Status: DC | PRN
  Administered 2015-01-06: 1000 mL

## 2015-01-06 MED ORDER — MIDAZOLAM HCL 5 MG/5ML IJ SOLN
INTRAMUSCULAR | Status: DC | PRN
  Administered 2015-01-06: 2 mg via INTRAVENOUS

## 2015-01-06 MED ORDER — ONDANSETRON HCL 4 MG/2ML IJ SOLN: 4.0000 mg | Freq: Four times a day (QID) | INTRAMUSCULAR | Status: DC | PRN

## 2015-01-06 MED ORDER — LACTATED RINGERS IV SOLN
INTRAVENOUS | Status: DC | PRN
  Administered 2015-01-06: 14:00:00 via INTRAVENOUS

## 2015-01-06 MED ORDER — FENTANYL CITRATE 0.05 MG/ML IJ SOLN
INTRAMUSCULAR | Status: DC | PRN
  Administered 2015-01-06: 100 ug via INTRAVENOUS
  Administered 2015-01-06 (×3): 50 ug via INTRAVENOUS

## 2015-01-06 MED ORDER — LIDOCAINE HCL (CARDIAC) 20 MG/ML IV SOLN
INTRAVENOUS | Status: AC
  Filled 2015-01-06: qty 5

## 2015-01-06 MED ORDER — HYDROMORPHONE HCL 1 MG/ML IJ SOLN: 0.2500 mg | INTRAMUSCULAR | Status: DC | PRN

## 2015-01-06 MED ORDER — LIDOCAINE HCL (CARDIAC) 20 MG/ML IV SOLN
INTRAVENOUS | Status: DC | PRN
  Administered 2015-01-06: 60 mg via INTRAVENOUS

## 2015-01-06 MED ORDER — PROPOFOL 10 MG/ML IV BOLUS
INTRAVENOUS | Status: DC | PRN
  Administered 2015-01-06: 140 mg via INTRAVENOUS

## 2015-01-06 MED ORDER — ARTIFICIAL TEARS OP OINT
TOPICAL_OINTMENT | OPHTHALMIC | Status: AC
  Filled 2015-01-06: qty 3.5

## 2015-01-06 MED ORDER — ROCURONIUM BROMIDE 50 MG/5ML IV SOLN
INTRAVENOUS | Status: AC
  Filled 2015-01-06: qty 1

## 2015-01-06 MED ORDER — NEOSTIGMINE METHYLSULFATE 10 MG/10ML IV SOLN
INTRAVENOUS | Status: DC | PRN
  Administered 2015-01-06: 4 mg via INTRAVENOUS

## 2015-01-06 MED ORDER — PROPOFOL 10 MG/ML IV BOLUS
INTRAVENOUS | Status: AC
  Filled 2015-01-06: qty 20

## 2015-01-06 MED ORDER — 0.9 % SODIUM CHLORIDE (POUR BTL) OPTIME
TOPICAL | Status: DC | PRN
  Administered 2015-01-06: 1000 mL

## 2015-01-06 MED ORDER — MIDAZOLAM HCL 2 MG/2ML IJ SOLN
INTRAMUSCULAR | Status: AC
  Filled 2015-01-06: qty 2

## 2015-01-06 MED ORDER — ROCURONIUM BROMIDE 100 MG/10ML IV SOLN
INTRAVENOUS | Status: DC | PRN
  Administered 2015-01-06: 20 mg via INTRAVENOUS

## 2015-01-06 MED ORDER — NEOSTIGMINE METHYLSULFATE 10 MG/10ML IV SOLN
INTRAVENOUS | Status: AC
  Filled 2015-01-06: qty 2

## 2015-01-06 MED ORDER — ONDANSETRON HCL 4 MG/2ML IJ SOLN
INTRAMUSCULAR | Status: AC
  Filled 2015-01-06: qty 2

## 2015-01-06 MED ORDER — ONDANSETRON HCL 4 MG/2ML IJ SOLN
INTRAMUSCULAR | Status: DC | PRN
  Administered 2015-01-06: 4 mg via INTRAVENOUS

## 2015-01-06 MED ORDER — SODIUM CHLORIDE 0.9 % IV SOLN
INTRAVENOUS | Status: DC | PRN
  Administered 2015-01-06: 15 mL

## 2015-01-06 MED ORDER — GLYCOPYRROLATE 0.2 MG/ML IJ SOLN
INTRAMUSCULAR | Status: DC | PRN
  Administered 2015-01-06: 0.6 mg via INTRAVENOUS

## 2015-01-06 MED ORDER — SUCCINYLCHOLINE CHLORIDE 20 MG/ML IJ SOLN
INTRAMUSCULAR | Status: DC | PRN
  Administered 2015-01-06: 100 mg via INTRAVENOUS

## 2015-01-06 MED ORDER — PHENYLEPHRINE 40 MCG/ML (10ML) SYRINGE FOR IV PUSH (FOR BLOOD PRESSURE SUPPORT)
PREFILLED_SYRINGE | INTRAVENOUS | Status: AC
  Filled 2015-01-06: qty 30

## 2015-01-06 MED ORDER — OXYCODONE HCL 5 MG/5ML PO SOLN: 5.0000 mg | Freq: Once | ORAL | Status: DC | PRN

## 2015-01-06 MED ORDER — FENTANYL CITRATE 0.05 MG/ML IJ SOLN
INTRAMUSCULAR | Status: AC
  Filled 2015-01-06: qty 5

## 2015-01-06 MED ORDER — OXYCODONE HCL 5 MG PO TABS: 5.0000 mg | ORAL_TABLET | Freq: Once | ORAL | Status: DC | PRN

## 2015-01-06 MED ORDER — ENOXAPARIN SODIUM 40 MG/0.4ML ~~LOC~~ SOLN
40.0000 mg | SUBCUTANEOUS | Status: DC
  Administered 2015-01-06 – 2015-01-08 (×3): 40 mg via SUBCUTANEOUS
  Filled 2015-01-06 (×4): qty 0.4

## 2015-01-06 SURGICAL SUPPLY — 47 items
APPLIER CLIP ROT 10 11.4 M/L (STAPLE) ×3
BLADE SURG ROTATE 9660 (MISCELLANEOUS) IMPLANT
CANISTER SUCTION 2500CC (MISCELLANEOUS) ×3 IMPLANT
CHLORAPREP W/TINT 26ML (MISCELLANEOUS) ×3 IMPLANT
CLIP APPLIE ROT 10 11.4 M/L (STAPLE) ×1 IMPLANT
COVER MAYO STAND STRL (DRAPES) ×3 IMPLANT
COVER SURGICAL LIGHT HANDLE (MISCELLANEOUS) ×3 IMPLANT
DRAPE C-ARM 42X72 X-RAY (DRAPES) ×3 IMPLANT
DRAPE LAPAROSCOPIC ABDOMINAL (DRAPES) ×3 IMPLANT
DRAPE WARM FLUID 44X44 (DRAPE) ×3 IMPLANT
ELECT REM PT RETURN 9FT ADLT (ELECTROSURGICAL) ×3
ELECTRODE REM PT RTRN 9FT ADLT (ELECTROSURGICAL) ×1 IMPLANT
GLOVE BIO SURGEON STRL SZ7 (GLOVE) ×3 IMPLANT
GLOVE BIO SURGEON STRL SZ8 (GLOVE) ×3 IMPLANT
GLOVE BIOGEL PI IND STRL 7.0 (GLOVE) ×1 IMPLANT
GLOVE BIOGEL PI IND STRL 7.5 (GLOVE) ×1 IMPLANT
GLOVE BIOGEL PI IND STRL 8 (GLOVE) ×1 IMPLANT
GLOVE BIOGEL PI INDICATOR 7.0 (GLOVE) ×2
GLOVE BIOGEL PI INDICATOR 7.5 (GLOVE) ×2
GLOVE BIOGEL PI INDICATOR 8 (GLOVE) ×2
GLOVE BIOGEL PI ORTHO PRO SZ7 (GLOVE) ×2
GLOVE ECLIPSE 7.5 STRL STRAW (GLOVE) ×3 IMPLANT
GLOVE PI ORTHO PRO STRL SZ7 (GLOVE) ×1 IMPLANT
GLOVE SURG SS PI 6.5 STRL IVOR (GLOVE) ×3 IMPLANT
GOWN STRL REUS W/ TWL LRG LVL3 (GOWN DISPOSABLE) ×3 IMPLANT
GOWN STRL REUS W/ TWL XL LVL3 (GOWN DISPOSABLE) ×1 IMPLANT
GOWN STRL REUS W/TWL LRG LVL3 (GOWN DISPOSABLE) ×6
GOWN STRL REUS W/TWL XL LVL3 (GOWN DISPOSABLE) ×2
KIT BASIN OR (CUSTOM PROCEDURE TRAY) ×3 IMPLANT
KIT ROOM TURNOVER OR (KITS) ×3 IMPLANT
LIQUID BAND (GAUZE/BANDAGES/DRESSINGS) ×3 IMPLANT
NS IRRIG 1000ML POUR BTL (IV SOLUTION) ×3 IMPLANT
PAD ARMBOARD 7.5X6 YLW CONV (MISCELLANEOUS) ×3 IMPLANT
POUCH SPECIMEN RETRIEVAL 10MM (ENDOMECHANICALS) ×3 IMPLANT
SCISSORS LAP 5X35 DISP (ENDOMECHANICALS) ×3 IMPLANT
SET CHOLANGIOGRAPH 5 50 .035 (SET/KITS/TRAYS/PACK) ×3 IMPLANT
SET IRRIG TUBING LAPAROSCOPIC (IRRIGATION / IRRIGATOR) ×3 IMPLANT
SLEEVE ENDOPATH XCEL 5M (ENDOMECHANICALS) ×3 IMPLANT
SPECIMEN JAR SMALL (MISCELLANEOUS) ×3 IMPLANT
SUT MNCRL AB 4-0 PS2 18 (SUTURE) ×6 IMPLANT
TOWEL OR 17X24 6PK STRL BLUE (TOWEL DISPOSABLE) IMPLANT
TOWEL OR 17X26 10 PK STRL BLUE (TOWEL DISPOSABLE) ×3 IMPLANT
TRAY LAPAROSCOPIC (CUSTOM PROCEDURE TRAY) ×3 IMPLANT
TROCAR XCEL BLUNT TIP 100MML (ENDOMECHANICALS) ×3 IMPLANT
TROCAR XCEL NON-BLD 11X100MML (ENDOMECHANICALS) ×3 IMPLANT
TROCAR XCEL NON-BLD 5MMX100MML (ENDOMECHANICALS) ×3 IMPLANT
TUBING INSUFFLATION (TUBING) ×3 IMPLANT

## 2015-01-06 NOTE — Anesthesia Procedure Notes (Signed)
Procedure Name: Intubation Date/Time: 01/06/2015 2:04 PM Performed by: Ollen Bowl Pre-anesthesia Checklist: Patient identified, Emergency Drugs available, Suction available, Patient being monitored and Timeout performed Patient Re-evaluated:Patient Re-evaluated prior to inductionOxygen Delivery Method: Circle system utilized and Simple face mask Preoxygenation: Pre-oxygenation with 100% oxygen Intubation Type: IV induction Ventilation: Mask ventilation without difficulty Laryngoscope Size: Mac and 3 Grade View: Grade I Tube type: Oral Tube size: 7.0 mm Number of attempts: 1 Airway Equipment and Method: Patient positioned with wedge pillow and Stylet Placement Confirmation: ETT inserted through vocal cords under direct vision,  positive ETCO2 and breath sounds checked- equal and bilateral Secured at: 21 cm Tube secured with: Tape Dental Injury: Teeth and Oropharynx as per pre-operative assessment

## 2015-01-06 NOTE — Op Note (Signed)
Laparoscopic Cholecystectomy with IOC Procedure Note  Indications: This patient presents with symptomatic gallbladder disease and will undergo laparoscopic cholecystectomy.The procedure has been discussed with the patient. Operative and non operative treatments have been discussed. Risks of surgery include bleeding, infection,  Common bile duct injury,  Injury to the stomach,liver, colon,small intestine, abdominal wall,  Diaphragm,  Major blood vessels,  And the need for an open procedure.  Other risks include worsening of medical problems, death,  DVT and pulmonary embolism, and cardiovascular events.   Medical options have also been discussed. The patient has been informed of long term expectations of surgery and non surgical options,  The patient agrees to proceed.    Pre-operative Diagnosis: Calculus of gallbladder with acute cholecystitis, without mention of obstruction  Post-operative Diagnosis: Same  Surgeon: Pace Lamadrid A.   Assistants: Hitchcock RNFA   Anesthesia: General endotracheal anesthesia and Local anesthesia 0.25.% bupivacaine, with epinephrine  ASA Class: 2  Procedure Details  The patient was seen again in the Holding Room. The risks, benefits, complications, treatment options, and expected outcomes were discussed with the patient. The possibilities of reaction to medication, pulmonary aspiration, perforation of viscus, bleeding, recurrent infection, finding a normal gallbladder, the need for additional procedures, failure to diagnose a condition, the possible need to convert to an open procedure, and creating a complication requiring transfusion or operation were discussed with the patient. The patient and/or family concurred with the proposed plan, giving informed consent. The site of surgery properly noted/marked. The patient was taken to Operating Room, identified as Andrea Carroll and the procedure verified as Laparoscopic Cholecystectomy with Intraoperative  Cholangiograms. A Time Out was held and the above information confirmed.  Prior to the induction of general anesthesia, antibiotic prophylaxis was administered. General endotracheal anesthesia was then administered and tolerated well. After the induction, the abdomen was prepped in the usual sterile fashion. The patient was positioned in the supine position with the left arm comfortably tucked, along with some reverse Trendelenburg.  Local anesthetic agent was injected into the skin near the umbilicus and an incision made. The midline fascia was incised and the Hasson technique was used to introduce a 12 mm port under direct vision. It was secured with a figure of eight Vicryl suture placed in the usual fashion. Pneumoperitoneum was then created with CO2 and tolerated well without any adverse changes in the patient's vital signs. Additional trocars were introduced under direct vision with an 11 mm trocar in the epigastrium and 2 5 mm trocars in the right upper quadrant. All skin incisions were infiltrated with a local anesthetic agent before making the incision and placing the trocars.   The gallbladder was identified, the fundus grasped and retracted cephalad. Adhesions were lysed bluntly and with the electrocautery where indicated, taking care not to injure any adjacent organs or viscus. The infundibulum was grasped and retracted laterally, exposing the peritoneum overlying the triangle of Calot. This was then divided and exposed in a blunt fashion. The cystic duct was clearly identified and bluntly dissected circumferentially. The junctions of the gallbladder, cystic duct and common bile duct were clearly identified prior to the division of any linear structure.   An incision was made in the cystic duct and the cholangiogram catheter introduced. The catheter was secured using an endoclip. The study showed no stones and good visualization of the distal and proximal biliary tree.  There was some  extravasation of contrast around the catheter.   I could see the biliary tree without any obvious  surgery.  The catheter was then removed.   The cystic duct was then  ligated with surgical clips  on the patient side and  clipped on the gallbladder side and divided. The cystic artery was identified, dissected free, ligated with clips and divided as well. Posterior cystic artery clipped and divided.  The gallbladder was dissected from the liver bed in retrograde fashion with the electrocautery. The gallbladder was removed. The liver bed was irrigated and inspected. Hemostasis was achieved with the electrocautery. Copious irrigation was utilized and was repeatedly aspirated until clear all particulate matter. Hemostasis was achieved with no signs  Of bleeding or bile leakage.  Pneumoperitoneum was completely reduced after viewing removal of the trocars under direct vision. The wound was thoroughly irrigated and the fascia was then closed with a figure of eight suture; the skin was then closed with 4 0 MONOCRYL  and a sterile dressing was applied.  Instrument, sponge, and needle counts were correct at closure and at the conclusion of the case.   Findings: Cholecystitis with Cholelithiasis  Estimated Blood Loss: less than 50 mL                Total IV Fluids: 1000 mL         Specimens: Gallbladder           Complications: None; patient tolerated the procedure well.         Disposition: PACU - hemodynamically stable.         Condition: stable

## 2015-01-06 NOTE — Transfer of Care (Signed)
Immediate Anesthesia Transfer of Care Note  Patient: Andrea Carroll  Procedure(s) Performed: Procedure(s): LAPAROSCOPIC CHOLECYSTECTOMY WITH INTRAOPERATIVE CHOLANGIOGRAM (N/A)  Patient Location: PACU  Anesthesia Type:General  Level of Consciousness: awake, alert  and oriented  Airway & Oxygen Therapy: Patient Spontanous Breathing and Patient connected to nasal cannula oxygen  Post-op Assessment: Report given to RN, Post -op Vital signs reviewed and stable and Patient moving all extremities X 4  Post vital signs: Reviewed and stable  Last Vitals:  Filed Vitals:   01/06/15 1205  BP: 160/70  Pulse: 84  Temp: 36.8 C  Resp: 16    Complications: No apparent anesthesia complications

## 2015-01-06 NOTE — Anesthesia Preprocedure Evaluation (Signed)
Anesthesia Evaluation  Patient identified by MRN, date of birth, ID band Patient awake    Reviewed: Allergy & Precautions, NPO status , Patient's Chart, lab work & pertinent test results  Airway Mallampati: II   Neck ROM: full    Dental   Pulmonary neg pulmonary ROS,          Cardiovascular negative cardio ROS      Neuro/Psych    GI/Hepatic GERD-  ,  Endo/Other    Renal/GU      Musculoskeletal   Abdominal   Peds  Hematology   Anesthesia Other Findings   Reproductive/Obstetrics                             Anesthesia Physical Anesthesia Plan  ASA: II  Anesthesia Plan: General   Post-op Pain Management:    Induction: Intravenous  Airway Management Planned: Oral ETT  Additional Equipment:   Intra-op Plan:   Post-operative Plan: Extubation in OR  Informed Consent: I have reviewed the patients History and Physical, chart, labs and discussed the procedure including the risks, benefits and alternatives for the proposed anesthesia with the patient or authorized representative who has indicated his/her understanding and acceptance.     Plan Discussed with: CRNA, Anesthesiologist and Surgeon  Anesthesia Plan Comments:         Anesthesia Quick Evaluation

## 2015-01-06 NOTE — Interval H&P Note (Signed)
History and Physical Interval Note:  01/06/2015 1:18 PM  Andrea Carroll  has presented today for surgery, with the diagnosis of Gallstones/Acute Cholecystitis  The various methods of treatment have been discussed with the patient and family. After consideration of risks, benefits and other options for treatment, the patient has consented to  Procedure(s): LAPAROSCOPIC CHOLECYSTECTOMY WITH INTRAOPERATIVE CHOLANGIOGRAM (N/A) as a surgical intervention .  The patient's history has been reviewed, patient examined, no change in status, stable for surgery.  I have reviewed the patient's chart and labs.  Questions were answered to the patient's satisfaction.     [name] A.

## 2015-01-06 NOTE — Care Management (Signed)
Medicare en . Magdalen Spatz RN BSN

## 2015-01-06 NOTE — Anesthesia Postprocedure Evaluation (Signed)
Anesthesia Post Note  Patient: Andrea Carroll  Procedure(s) Performed: Procedure(s) (LRB): LAPAROSCOPIC CHOLECYSTECTOMY WITH INTRAOPERATIVE CHOLANGIOGRAM (N/A)  Anesthesia type: General  Patient location: PACU  Post pain: Pain level controlled and Adequate analgesia  Post assessment: Post-op Vital signs reviewed, Patient's Cardiovascular Status Stable, Respiratory Function Stable, Patent Airway and Pain level controlled  Last Vitals:  Filed Vitals:   01/06/15 1614  BP: 135/64  Pulse: 81  Temp: 37 C  Resp: 16    Post vital signs: Reviewed and stable  Level of consciousness: awake, alert  and oriented  Complications: No apparent anesthesia complications

## 2015-01-06 NOTE — H&P (View-Only) (Signed)
Subjective:no change overnight  Objective: Vital signs in last 24 hours: Temp:  [97.7 F (36.5 C)-98.2 F (36.8 C)] 98.2 F (36.8 C) (02/09 0602) Pulse Rate:  [88-98] 88 (02/09 0602) Resp:  [17-18] 17 (02/09 0602) BP: (132-150)/(49-74) 137/60 mmHg (02/09 0602) SpO2:  [88 %-100 %] 96 % (02/09 0602) Weight:  [144 lb (65.318 kg)-149 lb 14.6 oz (68 kg)] 149 lb 14.6 oz (68 kg) (02/08 2038) Last BM Date: 01/05/15  Intake/Output from previous day: 02/08 0701 - 02/09 0700 In: 1237.5 [I.V.:1237.5] Out: -  Intake/Output this shift:    GI: tender RUQ  Lab Results:   Recent Labs  01/05/15 1143 01/06/15 0518  WBC 7.9 8.8  HGB 11.6* 10.6*  HCT 34.7* 32.2*  PLT 257 286   BMET  Recent Labs  01/05/15 1143 01/06/15 0518  NA 141 142  K 2.9* 3.5  CL 103 106  CO2 27 29  GLUCOSE 108* 121*  BUN 9 <5*  CREATININE 0.64 0.56  CALCIUM 8.7 8.4   PT/INR  Recent Labs  01/06/15 0518  LABPROT 14.2  INR 1.09   ABG No results for input(s): PHART, HCO3 in the last 72 hours.  Invalid input(s): PCO2, PO2  Studies/Results: Dg Chest 2 View  01/05/2015   CLINICAL DATA:  Fever and cough.  EXAM: CHEST  2 VIEW  COMPARISON:  None.  FINDINGS: Heart size and mediastinal contours are within normal limits. Both lungs are clear. Visualized skeletal structures are unremarkable.  IMPRESSION: Negative exam.   Electronically Signed   By: Inge Rise M.D.   On: 01/05/2015 12:55   US Abdomen Complete  01/05/2015   CLINICAL DATA:  Right upper quadrant tenderness and elevated liver function tests.  EXAM: ULTRASOUND ABDOMEN COMPLETE  COMPARISON:  None.  FINDINGS: Gallbladder: Cholelithiasis with large stone in the gallbladder neck measuring about 2.1 cm diameter an additional smaller stones and sludge in the remainder of the gallbladder. Diffuse gallbladder wall thickening measuring up to 4.5 mm. Murphy's sign is negative.  Common bile duct: Diameter: 4.5 mm, normal  Liver: No focal lesion  identified. Within normal limits in parenchymal echogenicity.  IVC: No abnormality visualized.  Pancreas: Visualized portion unremarkable.  Spleen: Size and appearance within normal limits.  Right Kidney: Length: 10.4 cm. Echogenicity within normal limits. No mass or hydronephrosis visualized.  Left Kidney: Length: 11.1 cm. Echogenicity within normal limits. No mass or hydronephrosis visualized.  Abdominal aorta: No aneurysm visualized.  Other findings: None.  IMPRESSION: Abnormal gallbladder with gallbladder wall thickening, cholelithiasis, and sludge. Murphy's sign is negative. Findings are nonspecific but may indicate acute or chronic cholecystitis. No bile duct dilatation.   Electronically Signed   By: Lucienne Capers M.D.   On: 01/05/2015 18:49    Anti-infectives: Anti-infectives    Start     Dose/Rate Route Frequency Ordered Stop   01/06/15 1200  cefTRIAXone (ROCEPHIN) 2 g in dextrose 5 % 50 mL IVPB - Premix    Comments:  Cholecystitis, if already received a dose in the ED please start in 24 hours   2 g100 mL/hr over 30 Minutes Intravenous Every 24 hours 01/05/15 2046     01/05/15 1630  cefTRIAXone (ROCEPHIN) 1 g in dextrose 5 % 50 mL IVPB     1 g100 mL/hr over 30 Minutes Intravenous  Once 01/05/15 1626 01/05/15 1951      Assessment/Plan: For LGB  IOC today.The procedure has been discussed with the patient. Operative and non operative treatments have been discussed. Risks of  surgery include bleeding, infection,  Common bile duct injury,  Injury to the stomach,liver, colon,small intestine, abdominal wall,  Diaphragm,  Major blood vessels,  And the need for an open procedure.  Other risks include worsening of medical problems, death,  DVT and pulmonary embolism, and cardiovascular events.   Medical options have also been discussed. The patient has been informed of long term expectations of surgery and non surgical options,  The patient agrees to proceed.     LOS: 1 day    Nayleen Janosik  A. 01/06/2015

## 2015-01-06 NOTE — Progress Notes (Signed)
Called Dr.Hodierne for sign out  

## 2015-01-06 NOTE — Progress Notes (Signed)
Subjective:no change overnight  Objective: Vital signs in last 24 hours: Temp:  [97.7 F (36.5 C)-98.2 F (36.8 C)] 98.2 F (36.8 C) (02/09 0602) Pulse Rate:  [88-98] 88 (02/09 0602) Resp:  [17-18] 17 (02/09 0602) BP: (132-150)/(49-74) 137/60 mmHg (02/09 0602) SpO2:  [88 %-100 %] 96 % (02/09 0602) Weight:  [144 lb (65.318 kg)-149 lb 14.6 oz (68 kg)] 149 lb 14.6 oz (68 kg) (02/08 2038) Last BM Date: 01/05/15  Intake/Output from previous day: 02/08 0701 - 02/09 0700 In: 1237.5 [I.V.:1237.5] Out: -  Intake/Output this shift:    GI: tender RUQ  Lab Results:   Recent Labs  01/05/15 1143 01/06/15 0518  WBC 7.9 8.8  HGB 11.6* 10.6*  HCT 34.7* 32.2*  PLT 257 286   BMET  Recent Labs  01/05/15 1143 01/06/15 0518  NA 141 142  K 2.9* 3.5  CL 103 106  CO2 27 29  GLUCOSE 108* 121*  BUN 9 <5*  CREATININE 0.64 0.56  CALCIUM 8.7 8.4   PT/INR  Recent Labs  01/06/15 0518  LABPROT 14.2  INR 1.09   ABG No results for input(s): PHART, HCO3 in the last 72 hours.  Invalid input(s): PCO2, PO2  Studies/Results: Dg Chest 2 View  01/05/2015   CLINICAL DATA:  Fever and cough.  EXAM: CHEST  2 VIEW  COMPARISON:  None.  FINDINGS: Heart size and mediastinal contours are within normal limits. Both lungs are clear. Visualized skeletal structures are unremarkable.  IMPRESSION: Negative exam.   Electronically Signed   By: Inge Rise M.D.   On: 01/05/2015 12:55   US Abdomen Complete  01/05/2015   CLINICAL DATA:  Right upper quadrant tenderness and elevated liver function tests.  EXAM: ULTRASOUND ABDOMEN COMPLETE  COMPARISON:  None.  FINDINGS: Gallbladder: Cholelithiasis with large stone in the gallbladder neck measuring about 2.1 cm diameter an additional smaller stones and sludge in the remainder of the gallbladder. Diffuse gallbladder wall thickening measuring up to 4.5 mm. Murphy's sign is negative.  Common bile duct: Diameter: 4.5 mm, normal  Liver: No focal lesion  identified. Within normal limits in parenchymal echogenicity.  IVC: No abnormality visualized.  Pancreas: Visualized portion unremarkable.  Spleen: Size and appearance within normal limits.  Right Kidney: Length: 10.4 cm. Echogenicity within normal limits. No mass or hydronephrosis visualized.  Left Kidney: Length: 11.1 cm. Echogenicity within normal limits. No mass or hydronephrosis visualized.  Abdominal aorta: No aneurysm visualized.  Other findings: None.  IMPRESSION: Abnormal gallbladder with gallbladder wall thickening, cholelithiasis, and sludge. Murphy's sign is negative. Findings are nonspecific but may indicate acute or chronic cholecystitis. No bile duct dilatation.   Electronically Signed   By: Lucienne Capers M.D.   On: 01/05/2015 18:49    Anti-infectives: Anti-infectives    Start     Dose/Rate Route Frequency Ordered Stop   01/06/15 1200  cefTRIAXone (ROCEPHIN) 2 g in dextrose 5 % 50 mL IVPB - Premix    Comments:  Cholecystitis, if already received a dose in the ED please start in 24 hours   2 g100 mL/hr over 30 Minutes Intravenous Every 24 hours 01/05/15 2046     01/05/15 1630  cefTRIAXone (ROCEPHIN) 1 g in dextrose 5 % 50 mL IVPB     1 g100 mL/hr over 30 Minutes Intravenous  Once 01/05/15 1626 01/05/15 1951      Assessment/Plan: For LGB  IOC today.The procedure has been discussed with the patient. Operative and non operative treatments have been discussed. Risks of  surgery include bleeding, infection,  Common bile duct injury,  Injury to the stomach,liver, colon,small intestine, abdominal wall,  Diaphragm,  Major blood vessels,  And the need for an open procedure.  Other risks include worsening of medical problems, death,  DVT and pulmonary embolism, and cardiovascular events.   Medical options have also been discussed. The patient has been informed of long term expectations of surgery and non surgical options,  The patient agrees to proceed.     LOS: 1 day    Andrea Carroll  A. 01/06/2015

## 2015-01-07 ENCOUNTER — Encounter (HOSPITAL_COMMUNITY): Payer: Self-pay | Admitting: Surgery

## 2015-01-07 LAB — COMPREHENSIVE METABOLIC PANEL
ALT: 79 U/L — AB (ref 0–35)
AST: 60 U/L — AB (ref 0–37)
Albumin: 2.4 g/dL — ABNORMAL LOW (ref 3.5–5.2)
Alkaline Phosphatase: 282 U/L — ABNORMAL HIGH (ref 39–117)
Anion gap: 11 (ref 5–15)
BILIRUBIN TOTAL: 0.9 mg/dL (ref 0.3–1.2)
BUN: <5 mg/dL — ABNORMAL LOW (ref 6–23)
CHLORIDE: 100 mmol/L (ref 96–112)
CO2: 26 mmol/L (ref 19–32)
Calcium: 8.4 mg/dL (ref 8.4–10.5)
Creatinine, Ser: 0.7 mg/dL (ref 0.50–1.10)
GFR calc Af Amer: >90 mL/min (ref >90)
GFR, EST NON AFRICAN AMERICAN: 86 mL/min — AB (ref >90)
Glucose, Bld: 123 mg/dL — ABNORMAL HIGH (ref 70–99)
Potassium: 3.7 mmol/L (ref 3.5–5.1)
SODIUM: 137 mmol/L (ref 135–145)
Total Protein: 6.2 g/dL (ref 6.0–8.3)

## 2015-01-07 LAB — CBC
HEMATOCRIT: 34.7 % — AB (ref 36.0–46.0)
HEMOGLOBIN: 11.4 g/dL — AB (ref 12.0–15.0)
MCH: 29.7 pg (ref 26.0–34.0)
MCHC: 32.9 g/dL (ref 30.0–36.0)
MCV: 90.4 fL (ref 78.0–100.0)
Platelets: 378 10*3/uL (ref 150–400)
RBC: 3.84 MIL/uL — ABNORMAL LOW (ref 3.87–5.11)
RDW: 14 % (ref 11.5–15.5)
WBC: 15.6 10*3/uL — AB (ref 4.0–10.5)

## 2015-01-07 MED ORDER — CIPROFLOXACIN HCL 500 MG PO TABS: 500.0000 mg | ORAL_TABLET | Freq: Two times a day (BID) | ORAL | Status: DC

## 2015-01-07 MED ORDER — TRAMADOL HCL 50 MG PO TABS: 50.0000 mg | ORAL_TABLET | Freq: Four times a day (QID) | ORAL | Status: AC | PRN

## 2015-01-07 MED ORDER — TRAMADOL HCL 50 MG PO TABS
50.0000 mg | ORAL_TABLET | Freq: Four times a day (QID) | ORAL | Status: DC | PRN
  Administered 2015-01-07 – 2015-01-08 (×2): 100 mg via ORAL
  Filled 2015-01-07 (×2): qty 2

## 2015-01-07 MED ORDER — CIPROFLOXACIN HCL 500 MG PO TABS
500.0000 mg | ORAL_TABLET | Freq: Two times a day (BID) | ORAL | Status: DC
  Administered 2015-01-07 – 2015-01-09 (×5): 500 mg via ORAL
  Filled 2015-01-07 (×7): qty 1

## 2015-01-07 NOTE — Discharge Summary (Signed)
Patient ID: Andrea Carroll MRN: [id number] DOB/AGE: 07/19/1946 69 y.o.  Admit date: 01/05/2015 Discharge date: 01/09/2015  Procedures: lap chole with IOC  Consults: None  Reason for Admission: Andrea Carroll complains of a 7 day history of malaise. She had subjective fevers at home. She thought she had the flu and was given a prescription for Tamiflu by her primary care physician. She had a reaction to that medication which included tachycardia so she stopped taking it. She continued to feel poorly and had an appointment for later today, however, she acutely felt worse and came via EMS to the emergency department. Evaluation here included laboratory studies showing likely urinary tract infection, hypokalemia, and elevated liver function tests. Abdominal ultrasound demonstrates cholelithiasis with a stone in the neck of the gallbladder and some gallbladder wall thickening. I was asked to evaluate her for surgical treatment. She has received potassium replacement in the emergency department. Her RUQ pain is currently mild.  Admission Diagnoses:  1. Cholecystitis with cholelithiasis 2. Possible UTI 3. hypokalemia  Hospital Course: The patient was admitted and taken to the OR room where she underwent the above procedure.  Her gallbladder was noted to be purulent.  Her abx therapy was continued after surgery.  Her LFTs increased slightly after surgery.  She was feeling well on POD 1 with appropriate soreness. She struggled later in the day with malaise and more pain.  The following day her pain was much better, but she had a fever of 102.4.  She was continued on her oral Cipro.  Her WBC was trending down to 13K, but she remained inpatient to make sure her fever broke.  Her LFTs were trending down as well.  By POD 3, her fever curve had dissipated and she felt great.  She was stable for dc home at this time.  PE: Abd: soft, appropriately tender, +BS, incisions c/d/i   Discharge Diagnoses:  Active Problems:  Cholecystitis with cholelithiasis s/p lap chole   Discharge Medications:   Medication List    TAKE these medications        aspirin 81 MG tablet  Take 81 mg by mouth daily.     calcium carbonate 600 MG Tabs tablet  Commonly known as:  OS-CAL  Take 600 mg by mouth 2 (two) times daily with a meal.     ciprofloxacin 500 MG tablet  Commonly known as:  CIPRO  Take 1 tablet (500 mg total) by mouth 2 (two) times daily.     DETROL LA 4 MG 24 hr capsule  Generic drug:  tolterodine  Take 4 mg by mouth daily.     Fish Oil 1000 MG Caps  Take 1 capsule by mouth daily.     GLUCOSAMINE 1500 COMPLEX Caps  Take 1 capsule by mouth daily.     lansoprazole 30 MG capsule  Commonly known as:  PREVACID  Take 30 mg by mouth daily.     MULTI VITAMIN DAILY PO  Take 1 tablet by mouth daily.     traMADol 50 MG tablet  Commonly known as:  ULTRAM  Take 1-2 tablets (50-100 mg total) by mouth every 6 (six) hours as needed for moderate pain.     Vitamin D (Ergocalciferol) 50000 UNITS Caps capsule  Commonly known as:  DRISDOL  Take 50,000 Units by mouth every 7 (seven) days. friday        Discharge Instructions:     Follow-up Information    Follow up with [facility] On 01/27/2015.  Why:  3:30pm, arrive no later than 3:00pm for paperwork   Contact information:   [address] [place]  [address] [phone]       Signed: Henreitta Cea 01/07/2015, 9:59 AM

## 2015-01-07 NOTE — Discharge Instructions (Signed)
[facility] ______[facility] °LAPAROSCOPIC SURGERY: POST OP INSTRUCTIONS °Always review your discharge instruction sheet given to you by the facility where your surgery was performed. °IF YOU HAVE DISABILITY OR FAMILY LEAVE FORMS, YOU MUST BRING THEM TO THE OFFICE FOR PROCESSING.   °DO NOT GIVE THEM TO YOUR DOCTOR. ° °1. A prescription for pain medication may be given to you upon discharge.  Take your pain medication as prescribed, if needed.  If narcotic pain medicine is not needed, then you may take acetaminophen (Tylenol) or ibuprofen (Advil) as needed. °2. Take your usually prescribed medications unless otherwise directed. °3. If you need a refill on your pain medication, please contact your pharmacy.  They will contact our office to request authorization. Prescriptions will not be filled after 5pm or on week-ends. °4. You should follow a light diet the first few days after arrival home, such as soup and crackers, etc.  Be sure to include lots of fluids daily. °5. Most patients will experience some swelling and bruising in the area of the incisions.  Ice packs will help.  Swelling and bruising can take several days to resolve.  °6. It is common to experience some constipation if taking pain medication after surgery.  Increasing fluid intake and taking a stool softener (such as Colace) will usually help or prevent this problem from occurring.  A mild laxative (Milk of Magnesia or Miralax) should be taken according to package instructions if there are no bowel movements after 48 hours. °7. Unless discharge instructions indicate otherwise, you may remove your bandages 24-48 hours after surgery, and you may shower at that time.  You may have steri-strips (small skin tapes) in place directly over the incision.  These strips should be left on the skin for 7-10 days.  If your surgeon used skin glue on the incision, you may shower in 24 hours.  The glue will flake off over the next 2-3 weeks.  Any sutures or  staples will be removed at the office during your follow-up visit. °8. ACTIVITIES:  You may resume regular (light) daily activities beginning the next day--such as daily self-care, walking, climbing stairs--gradually increasing activities as tolerated.  You may have sexual intercourse when it is comfortable.  Refrain from any heavy lifting or straining until approved by your doctor. °a. You may drive when you are no longer taking prescription pain medication, you can comfortably wear a seatbelt, and you can safely maneuver your car and apply brakes. °b. RETURN TO WORK:  __________________________________________________________ °9. You should see your doctor in the office for a follow-up appointment approximately 2-3 weeks after your surgery.  Make sure that you call for this appointment within a day or two after you arrive home to insure a convenient appointment time. °10. OTHER INSTRUCTIONS: __________________________________________________________________________________________________________________________ __________________________________________________________________________________________________________________________ °WHEN TO CALL YOUR DOCTOR: °1. Fever over 101.0 °2. Inability to urinate °3. Continued bleeding from incision. °4. Increased pain, redness, or drainage from the incision. °5. Increasing abdominal pain ° °The clinic staff is available to answer your questions during regular business hours.  Please don’t hesitate to call and ask to speak to one of the nurses for clinical concerns.  If you have a medical emergency, go to the nearest emergency room or call 911.  A surgeon from [facility] is always on call at the hospital. °[address], [address], [place], [place]  [address] ? [address] [address], [place], [place]   [address] °[phone] ? [phone] ? FAX [phone] °Web site:   [email or web address] °

## 2015-01-07 NOTE — Progress Notes (Signed)
Patient ID: Andrea Carroll, female   DOB: 1946/01/04, 69 y.o.   MRN: [id number] 1 Day Post-Op  Subjective: Pt c/o some soreness this morning, but otherwise doing ok.  Tolerating solid diet, but small appetite.  Objective: Vital signs in last 24 hours: Temp:  [98.1 F (36.7 C)-99.1 F (37.3 C)] 98.8 F (37.1 C) (02/10 1304) Pulse Rate:  [73-94] 93 (02/10 1304) Resp:  [12-17] 17 (02/10 1304) BP: (122-144)/(58-73) 122/59 mmHg (02/10 1304) SpO2:  [93 %-100 %] 93 % (02/10 1304) Last BM Date: 01/05/15  Intake/Output from previous day: 02/09 0701 - 02/10 0700 In: 2943.8 [P.O.:310; I.V.:2633.8] Out: 2 [Urine:2] Intake/Output this shift: Total I/O In: 360 [P.O.:360] Out: -   PE: Abd: soft, appropriately tender, +BS, ND, incisions c/d/i  Lab Results:   Recent Labs  01/06/15 0518 01/07/15 0527  WBC 8.8 15.6*  HGB 10.6* 11.4*  HCT 32.2* 34.7*  PLT 286 378   BMET  Recent Labs  01/06/15 0518 01/07/15 0527  NA 142 137  K 3.5 3.7  CL 106 100  CO2 29 26  GLUCOSE 121* 123*  BUN <5* <5*  CREATININE 0.56 0.70  CALCIUM 8.4 8.4   PT/INR  Recent Labs  01/06/15 0518  LABPROT 14.2  INR 1.09   CMP     Component Value Date/Time   NA 137 01/07/2015 0527   K 3.7 01/07/2015 0527   CL 100 01/07/2015 0527   CO2 26 01/07/2015 0527   GLUCOSE 123* 01/07/2015 0527   BUN <5* 01/07/2015 0527   CREATININE 0.70 01/07/2015 0527   CALCIUM 8.4 01/07/2015 0527   PROT 6.2 01/07/2015 0527   ALBUMIN 2.4* 01/07/2015 0527   AST 60* 01/07/2015 0527   ALT 79* 01/07/2015 0527   ALKPHOS 282* 01/07/2015 0527   BILITOT 0.9 01/07/2015 0527   GFRNONAA 86* 01/07/2015 0527   GFRAA >90 01/07/2015 0527   Lipase     Component Value Date/Time   LIPASE 23 01/05/2015 1745       Studies/Results: Dg Cholangiogram Operative  01/06/2015   CLINICAL DATA:  Cholecystitis, cholelithiasis  EXAM: INTRAOPERATIVE CHOLANGIOGRAM  TECHNIQUE: Cholangiographic images from the C-arm fluoroscopic device were  submitted for interpretation post-operatively. Please see the procedural report for the amount of contrast and the fluoroscopy time utilized.  COMPARISON:  Ultrasound 01/05/2015  FINDINGS: No persistent filling defects in the common duct. Intrahepatic ducts are incompletely visualized, appearing decompressed centrally. Some contrast reflux into the pancreatic duct. Contrast passes into the duodenum.  : Negative for retained common duct stone.   Electronically Signed   By: Lucrezia Europe M.D.   On: 01/06/2015 16:08   US Abdomen Complete  01/05/2015   CLINICAL DATA:  Right upper quadrant tenderness and elevated liver function tests.  EXAM: ULTRASOUND ABDOMEN COMPLETE  COMPARISON:  None.  FINDINGS: Gallbladder: Cholelithiasis with large stone in the gallbladder neck measuring about 2.1 cm diameter an additional smaller stones and sludge in the remainder of the gallbladder. Diffuse gallbladder wall thickening measuring up to 4.5 mm. Murphy's sign is negative.  Common bile duct: Diameter: 4.5 mm, normal  Liver: No focal lesion identified. Within normal limits in parenchymal echogenicity.  IVC: No abnormality visualized.  Pancreas: Visualized portion unremarkable.  Spleen: Size and appearance within normal limits.  Right Kidney: Length: 10.4 cm. Echogenicity within normal limits. No mass or hydronephrosis visualized.  Left Kidney: Length: 11.1 cm. Echogenicity within normal limits. No mass or hydronephrosis visualized.  Abdominal aorta: No aneurysm visualized.  Other findings: None.  IMPRESSION: Abnormal gallbladder with gallbladder wall thickening, cholelithiasis, and sludge. Murphy's sign is negative. Findings are nonspecific but may indicate acute or chronic cholecystitis. No bile duct dilatation.   Electronically Signed   By: Lucienne Capers M.D.   On: 01/05/2015 18:49    Anti-infectives: Anti-infectives    Start     Dose/Rate Route Frequency Ordered Stop   01/07/15 1000  ciprofloxacin (CIPRO) tablet 500 mg      500 mg Oral 2 times daily 01/07/15 0948     01/07/15 0000  ciprofloxacin (CIPRO) 500 MG tablet     500 mg Oral 2 times daily 01/07/15 0950     01/06/15 1200  cefTRIAXone (ROCEPHIN) 2 g in dextrose 5 % 50 mL IVPB - Premix  Status:  Discontinued    Comments:  Cholecystitis, if already received a dose in the ED please start in 24 hours   2 g 100 mL/hr over 30 Minutes Intravenous Every 24 hours 01/05/15 2046 01/07/15 0948   01/05/15 1630  cefTRIAXone (ROCEPHIN) 1 g in dextrose 5 % 50 mL IVPB     1 g 100 mL/hr over 30 Minutes Intravenous  Once 01/05/15 1626 01/05/15 1951       Assessment/Plan  1. POD 1, s/p lap chole for acute cholecystitis  Plan: 1. Patient had an infected gallbladder.  Still having some pain.  Would like to stay and have labs rechecked in the morning and get better pain control.   LOS: 2 days    Bandy Honaker E 01/07/2015, 2:06 PM Pager: [phone]

## 2015-01-08 LAB — COMPREHENSIVE METABOLIC PANEL
ALT: 55 U/L — ABNORMAL HIGH (ref 0–35)
ANION GAP: 9 (ref 5–15)
AST: 32 U/L (ref 0–37)
Albumin: 2 g/dL — ABNORMAL LOW (ref 3.5–5.2)
Alkaline Phosphatase: 212 U/L — ABNORMAL HIGH (ref 39–117)
BILIRUBIN TOTAL: 1.1 mg/dL (ref 0.3–1.2)
CHLORIDE: 101 mmol/L (ref 96–112)
CO2: 28 mmol/L (ref 19–32)
CREATININE: 0.64 mg/dL (ref 0.50–1.10)
Calcium: 7.9 mg/dL — ABNORMAL LOW (ref 8.4–10.5)
GFR calc Af Amer: >90 mL/min (ref >90)
GFR, EST NON AFRICAN AMERICAN: 88 mL/min — AB (ref >90)
GLUCOSE: 115 mg/dL — AB (ref 70–99)
Potassium: 3.7 mmol/L (ref 3.5–5.1)
Sodium: 138 mmol/L (ref 135–145)
Total Protein: 5.3 g/dL — ABNORMAL LOW (ref 6.0–8.3)

## 2015-01-08 LAB — CBC
HEMATOCRIT: 35.6 % — AB (ref 36.0–46.0)
Hemoglobin: 11.3 g/dL — ABNORMAL LOW (ref 12.0–15.0)
MCH: 29.4 pg (ref 26.0–34.0)
MCHC: 31.7 g/dL (ref 30.0–36.0)
MCV: 92.5 fL (ref 78.0–100.0)
Platelets: 461 10*3/uL — ABNORMAL HIGH (ref 150–400)
RBC: 3.85 MIL/uL — ABNORMAL LOW (ref 3.87–5.11)
RDW: 14.1 % (ref 11.5–15.5)
WBC: 13.4 10*3/uL — AB (ref 4.0–10.5)

## 2015-01-08 LAB — URINE CULTURE: Colony Count: >=100000

## 2015-01-08 MED ORDER — PANTOPRAZOLE SODIUM 40 MG PO TBEC
40.0000 mg | DELAYED_RELEASE_TABLET | Freq: Every day | ORAL | Status: DC
  Administered 2015-01-08: 40 mg via ORAL
  Filled 2015-01-08: qty 1

## 2015-01-08 NOTE — Progress Notes (Signed)
Patient ID: Andrea Carroll, female   DOB: [birth year], 69 y.o.   MRN: [id number] 2 Days Post-Op  Subjective: Pt feels ok today.  Pain controlled.  Ran a fever overnight of 102.4.    Objective: Vital signs in last 24 hours: Temp:  [97.9 F (36.6 C)-99.9 F (37.7 C)] 97.9 F (36.6 C) (02/11 0642) Pulse Rate:  [91-109] 91 (02/11 0642) Resp:  [16-18] 16 (02/11 0642) BP: (122-126)/(51-59) 126/54 mmHg (02/11 0642) SpO2:  [90 %-95 %] 95 % (02/11 0642) Last BM Date: 01/05/15  Intake/Output from previous day: 02/10 0701 - 02/11 0700 In: 1920 [P.O.:720; I.V.:1200] Out: 2 [Urine:2] Intake/Output this shift:    PE: Abd: soft, appropriately tender, +BS, ND, incisions c/d/i  Lab Results:   Recent Labs  01/06/15 0518 01/07/15 0527  WBC 8.8 15.6*  HGB 10.6* 11.4*  HCT 32.2* 34.7*  PLT 286 378   BMET  Recent Labs  01/07/15 0527 01/08/15 0554  NA 137 138  K 3.7 3.7  CL 100 101  CO2 26 28  GLUCOSE 123* 115*  BUN <5* <5*  CREATININE 0.70 0.64  CALCIUM 8.4 7.9*   PT/INR  Recent Labs  01/06/15 0518  LABPROT 14.2  INR 1.09   CMP     Component Value Date/Time   NA 138 01/08/2015 0554   K 3.7 01/08/2015 0554   CL 101 01/08/2015 0554   CO2 28 01/08/2015 0554   GLUCOSE 115* 01/08/2015 0554   BUN <5* 01/08/2015 0554   CREATININE 0.64 01/08/2015 0554   CALCIUM 7.9* 01/08/2015 0554   PROT 5.3* 01/08/2015 0554   ALBUMIN 2.0* 01/08/2015 0554   AST 32 01/08/2015 0554   ALT 55* 01/08/2015 0554   ALKPHOS 212* 01/08/2015 0554   BILITOT 1.1 01/08/2015 0554   GFRNONAA 88* 01/08/2015 0554   GFRAA >90 01/08/2015 0554   Lipase     Component Value Date/Time   LIPASE 23 01/05/2015 1745       Studies/Results: Dg Cholangiogram Operative  01/06/2015   CLINICAL DATA:  Cholecystitis, cholelithiasis  EXAM: INTRAOPERATIVE CHOLANGIOGRAM  TECHNIQUE: Cholangiographic images from the C-arm fluoroscopic device were submitted for interpretation post-operatively. Please see the  procedural report for the amount of contrast and the fluoroscopy time utilized.  COMPARISON:  Ultrasound 01/05/2015  FINDINGS: No persistent filling defects in the common duct. Intrahepatic ducts are incompletely visualized, appearing decompressed centrally. Some contrast reflux into the pancreatic duct. Contrast passes into the duodenum.  : Negative for retained common duct stone.   Electronically Signed   By: Lucrezia Europe M.D.   On: 01/06/2015 16:08    Anti-infectives: Anti-infectives    Start     Dose/Rate Route Frequency Ordered Stop   01/07/15 1000  ciprofloxacin (CIPRO) tablet 500 mg     500 mg Oral 2 times daily 01/07/15 0948     01/07/15 0000  ciprofloxacin (CIPRO) 500 MG tablet     500 mg Oral 2 times daily 01/07/15 0950     01/06/15 1200  cefTRIAXone (ROCEPHIN) 2 g in dextrose 5 % 50 mL IVPB - Premix  Status:  Discontinued    Comments:  Cholecystitis, if already received a dose in the ED please start in 24 hours   2 g 100 mL/hr over 30 Minutes Intravenous Every 24 hours 01/05/15 2046 01/07/15 0948   01/05/15 1630  cefTRIAXone (ROCEPHIN) 1 g in dextrose 5 % 50 mL IVPB     1 g 100 mL/hr over 30 Minutes Intravenous  Once 01/05/15 1626  01/05/15 1951       Assessment/Plan  1. POD 2, s/p lap chole for acute cholecystitis  2. Fever  Plan: 1. LFTs trending down.  Will check WBC since spiked a fever of 102.4 overnight.  May need to keep for another day.  Will d/w Dr. Brantley Stage   LOS: 3 days    Andrea Carroll E 01/08/2015, 9:38 AM Pager: [phone]

## 2015-01-09 LAB — COMPREHENSIVE METABOLIC PANEL
ALT: 39 U/L — AB (ref 0–35)
AST: 19 U/L (ref 0–37)
Albumin: 2.1 g/dL — ABNORMAL LOW (ref 3.5–5.2)
Alkaline Phosphatase: 210 U/L — ABNORMAL HIGH (ref 39–117)
Anion gap: 8 (ref 5–15)
BUN: 5 mg/dL — ABNORMAL LOW (ref 6–23)
CO2: 30 mmol/L (ref 19–32)
CREATININE: 0.66 mg/dL (ref 0.50–1.10)
Calcium: 7.8 mg/dL — ABNORMAL LOW (ref 8.4–10.5)
Chloride: 97 mmol/L (ref 96–112)
GFR calc non Af Amer: 87 mL/min — ABNORMAL LOW (ref >90)
GLUCOSE: 86 mg/dL (ref 70–99)
Potassium: 3.4 mmol/L — ABNORMAL LOW (ref 3.5–5.1)
Sodium: 135 mmol/L (ref 135–145)
TOTAL PROTEIN: 5.7 g/dL — AB (ref 6.0–8.3)
Total Bilirubin: 1.2 mg/dL (ref 0.3–1.2)

## 2015-01-09 LAB — CBC
HCT: 31.3 % — ABNORMAL LOW (ref 36.0–46.0)
Hemoglobin: 10 g/dL — ABNORMAL LOW (ref 12.0–15.0)
MCH: 29.2 pg (ref 26.0–34.0)
MCHC: 31.9 g/dL (ref 30.0–36.0)
MCV: 91.5 fL (ref 78.0–100.0)
PLATELETS: 416 10*3/uL — AB (ref 150–400)
RBC: 3.42 MIL/uL — AB (ref 3.87–5.11)
RDW: 13.7 % (ref 11.5–15.5)
WBC: 12.1 10*3/uL — AB (ref 4.0–10.5)

## 2015-01-09 NOTE — Progress Notes (Signed)
Andrea Carroll to be D/C'd Home per MD order.  Discussed with the patient and all questions fully answered.  VSS, Surgical incision sites clean, dry, intact with no sign of infection. IV catheter discontinued intact. Site without signs and symptoms of complications. Dressing and pressure applied.  An After Visit Summary was printed and given to the patient. Patient received prescriptions.  D/c education completed with patient/family including follow up instructions, medication list, d/c activities limitations if indicated, with other d/c instructions as indicated by MD - patient able to verbalize understanding, all questions fully answered.   Patient instructed to return to ED, call 911, or call MD for any changes in condition.   Patient escorted via [place], and D/C home via private auto.  Micki Riley 01/09/2015 7:56 AM

## 2015-01-09 NOTE — Care Management Note (Signed)
  Page 1 of 1   01/09/2015     2:19:05 PM CARE MANAGEMENT NOTE 01/09/2015  Patient:  Andrea Carroll, Andrea Carroll   Account Number:  [id number]  Date Initiated:  01/06/2015  Documentation initiated by:  Magdalen Spatz  Subjective/Objective Assessment:     Action/Plan:   Anticipated DC Date:     Anticipated DC Plan:           Choice offered to / List presented to:             Status of service:   Medicare Important Message given?  YES (If response is "NO", the following Medicare IM given date fields will be blank) Date Medicare IM given:  01/06/2015 Medicare IM given by:  Magdalen Spatz Date Additional Medicare IM given:  01/09/2015 Additional Medicare IM given by:  Magdalen Spatz  Discharge Disposition:    Per UR Regulation:    If discussed at Long Length of Stay Meetings, dates discussed:    Comments:   01-09-14 LAte note  01-09-15 IM was given this am at 0800. Magdalen Spatz RN BSN

## 2015-02-03 ENCOUNTER — Encounter (HOSPITAL_COMMUNITY): Payer: Self-pay | Admitting: Surgery

## 2015-02-03 NOTE — OR Nursing (Signed)
Late entry, delay code documentation.

## 2015-02-04 DIAGNOSIS — Z23 Encounter for immunization: Secondary | ICD-10-CM | POA: Diagnosis not present

## 2015-02-04 DIAGNOSIS — E876 Hypokalemia: Secondary | ICD-10-CM | POA: Diagnosis not present

## 2015-02-04 DIAGNOSIS — Z1389 Encounter for screening for other disorder: Secondary | ICD-10-CM | POA: Diagnosis not present

## 2015-02-04 DIAGNOSIS — N390 Urinary tract infection, site not specified: Secondary | ICD-10-CM | POA: Diagnosis not present

## 2015-02-04 DIAGNOSIS — M1990 Unspecified osteoarthritis, unspecified site: Secondary | ICD-10-CM | POA: Diagnosis not present

## 2015-02-04 DIAGNOSIS — Z9089 Acquired absence of other organs: Secondary | ICD-10-CM | POA: Diagnosis not present

## 2015-02-04 DIAGNOSIS — E785 Hyperlipidemia, unspecified: Secondary | ICD-10-CM | POA: Diagnosis not present

## 2015-02-04 DIAGNOSIS — K219 Gastro-esophageal reflux disease without esophagitis: Secondary | ICD-10-CM | POA: Diagnosis not present

## 2015-02-04 DIAGNOSIS — Z6823 Body mass index (BMI) 23.0-23.9, adult: Secondary | ICD-10-CM | POA: Diagnosis not present

## 2015-02-26 DIAGNOSIS — Z1231 Encounter for screening mammogram for malignant neoplasm of breast: Secondary | ICD-10-CM | POA: Diagnosis not present

## 2015-04-02 DIAGNOSIS — H10523 Angular blepharoconjunctivitis, bilateral: Secondary | ICD-10-CM | POA: Diagnosis not present

## 2015-04-02 DIAGNOSIS — H10413 Chronic giant papillary conjunctivitis, bilateral: Secondary | ICD-10-CM | POA: Diagnosis not present

## 2015-04-02 DIAGNOSIS — H2513 Age-related nuclear cataract, bilateral: Secondary | ICD-10-CM | POA: Diagnosis not present

## 2015-04-24 DIAGNOSIS — S80862A Insect bite (nonvenomous), left lower leg, initial encounter: Secondary | ICD-10-CM | POA: Diagnosis not present

## 2015-04-24 DIAGNOSIS — Z6822 Body mass index (BMI) 22.0-22.9, adult: Secondary | ICD-10-CM | POA: Diagnosis not present

## 2015-09-22 DIAGNOSIS — Z23 Encounter for immunization: Secondary | ICD-10-CM | POA: Diagnosis not present

## 2015-11-17 DIAGNOSIS — R8299 Other abnormal findings in urine: Secondary | ICD-10-CM | POA: Diagnosis not present

## 2015-11-17 DIAGNOSIS — N390 Urinary tract infection, site not specified: Secondary | ICD-10-CM | POA: Diagnosis not present

## 2015-11-17 DIAGNOSIS — E784 Other hyperlipidemia: Secondary | ICD-10-CM | POA: Diagnosis not present

## 2015-11-24 DIAGNOSIS — D485 Neoplasm of uncertain behavior of skin: Secondary | ICD-10-CM | POA: Diagnosis not present

## 2015-11-24 DIAGNOSIS — D1801 Hemangioma of skin and subcutaneous tissue: Secondary | ICD-10-CM | POA: Diagnosis not present

## 2015-11-24 DIAGNOSIS — D3613 Benign neoplasm of peripheral nerves and autonomic nervous system of lower limb, including hip: Secondary | ICD-10-CM | POA: Diagnosis not present

## 2015-11-25 DIAGNOSIS — Z1212 Encounter for screening for malignant neoplasm of rectum: Secondary | ICD-10-CM | POA: Diagnosis not present

## 2015-11-27 DIAGNOSIS — Z6822 Body mass index (BMI) 22.0-22.9, adult: Secondary | ICD-10-CM | POA: Diagnosis not present

## 2015-11-27 DIAGNOSIS — K219 Gastro-esophageal reflux disease without esophagitis: Secondary | ICD-10-CM | POA: Diagnosis not present

## 2015-11-27 DIAGNOSIS — Z9089 Acquired absence of other organs: Secondary | ICD-10-CM | POA: Diagnosis not present

## 2015-11-27 DIAGNOSIS — M1990 Unspecified osteoarthritis, unspecified site: Secondary | ICD-10-CM | POA: Diagnosis not present

## 2015-11-27 DIAGNOSIS — N390 Urinary tract infection, site not specified: Secondary | ICD-10-CM | POA: Diagnosis not present

## 2015-11-27 DIAGNOSIS — Z1389 Encounter for screening for other disorder: Secondary | ICD-10-CM | POA: Diagnosis not present

## 2015-11-27 DIAGNOSIS — Z780 Asymptomatic menopausal state: Secondary | ICD-10-CM | POA: Diagnosis not present

## 2015-11-27 DIAGNOSIS — Z0000 Encounter for general adult medical examination without abnormal findings: Secondary | ICD-10-CM | POA: Diagnosis not present

## 2015-11-27 DIAGNOSIS — E784 Other hyperlipidemia: Secondary | ICD-10-CM | POA: Diagnosis not present

## 2015-12-01 ENCOUNTER — Encounter: Payer: Self-pay | Admitting: Gastroenterology

## 2016-01-15 ENCOUNTER — Ambulatory Visit (AMBULATORY_SURGERY_CENTER): Payer: Self-pay | Admitting: *Deleted

## 2016-01-15 VITALS — Ht 62.0 in | Wt 131.0 lb

## 2016-01-15 DIAGNOSIS — Z1211 Encounter for screening for malignant neoplasm of colon: Secondary | ICD-10-CM

## 2016-01-15 MED ORDER — SUPREP BOWEL PREP KIT 17.5-3.13-1.6 GM/177ML PO SOLN: 1.0000 | Freq: Once | ORAL | Status: DC

## 2016-01-15 NOTE — Progress Notes (Signed)
Patient denies any allergies to egg or soy products. Patient denies complications with anesthesia/sedation.  Patient denies oxygen use at home and denies diet medications. Emmi instructions for colonoscopy explained but patient denied.     

## 2016-01-29 ENCOUNTER — Ambulatory Visit (AMBULATORY_SURGERY_CENTER): Payer: Medicare Other | Admitting: Gastroenterology

## 2016-01-29 ENCOUNTER — Encounter: Payer: Self-pay | Admitting: Gastroenterology

## 2016-01-29 VITALS — BP 115/49 | HR 68 | Temp 97.8°F | Resp 12 | Ht 62.0 in | Wt 131.0 lb

## 2016-01-29 DIAGNOSIS — D124 Benign neoplasm of descending colon: Secondary | ICD-10-CM | POA: Diagnosis not present

## 2016-01-29 DIAGNOSIS — Z1211 Encounter for screening for malignant neoplasm of colon: Secondary | ICD-10-CM

## 2016-01-29 DIAGNOSIS — D122 Benign neoplasm of ascending colon: Secondary | ICD-10-CM

## 2016-01-29 DIAGNOSIS — D123 Benign neoplasm of transverse colon: Secondary | ICD-10-CM | POA: Diagnosis not present

## 2016-01-29 MED ORDER — SODIUM CHLORIDE 0.9 % IV SOLN: 500.0000 mL | INTRAVENOUS | Status: DC

## 2016-01-29 NOTE — Progress Notes (Signed)
To recovery, report to [place], ZRN, ZVSS.

## 2016-01-29 NOTE — Op Note (Signed)
[facility]  [address] [place], [id number]   COLONOSCOPY PROCEDURE REPORT  PATIENT: Andrea Carroll, Andrea Carroll  MR#: [id number] BIRTHDATE: December 26, 1945 , 69  yrs. old GENDER: female ENDOSCOPIST: Yetta Flock, MD REFERRED BY: Dr. Dagmar Hait PROCEDURE DATE:  01/29/2016 PROCEDURE:   Colonoscopy, screening and Colonoscopy with snare polypectomy First Screening Colonoscopy - Avg.  risk and is 50 yrs.  old or older - No.  Prior Negative Screening - Now for repeat screening. N/A  History of Adenoma - Now for follow-up colonoscopy & has been > or = to 3 yrs.  N/A  Polyps removed today? Yes ASA CLASS:   Class II INDICATIONS:Screening for colonic neoplasia and Colorectal Neoplasm Risk Assessment for this procedure is average risk. MEDICATIONS: Propofol 250 mg IV  DESCRIPTION OF PROCEDURE:   After the risks benefits and alternatives of the procedure were thoroughly explained, informed consent was obtained.  The digital rectal exam revealed no abnormalities of the rectum.   The LB [id number]  endoscope was introduced through the anus and advanced to the cecum, which was identified by both the appendix and ileocecal valve. No adverse events experienced.   The quality of the prep was adequate  The instrument was then slowly withdrawn as the colon was fully examined. Estimated blood loss is zero unless otherwise noted in this procedure report.   COLON FINDINGS: There was a 53mm flat polyp with a mucous cap noted in the ascending colon and removed with cold snare.  Another 50mm flat polyp was noted in the transverse colon and removed with cold snare.  Mild diverticulosis was noted in the sigmoid colon.  The remainder of the colon was normal.  Retroflexed views revealed no abnormalities. The time to cecum = 2.4 Withdrawal time = 15.1   The scope was withdrawn and the procedure completed. COMPLICATIONS: There were no immediate complications.  ENDOSCOPIC IMPRESSION: 2 polyps  removed as outlined above Mild diverticulosis  RECOMMENDATIONS: Await pathology results No NSAIDS for 2 weeks Resume diet Resume medications  eSigned:  Yetta Flock, MD 01/29/2016 10:30 AM   cc:  Dr. Dagmar Hait, the patient

## 2016-01-29 NOTE — Patient Instructions (Signed)
YOU HAD AN ENDOSCOPIC PROCEDURE TODAY AT [facility]:   Refer to the procedure report that was given to you for any specific questions about what was found during the examination.  If the procedure report does not answer your questions, please call your gastroenterologist to clarify.  If you requested that your care partner not be given the details of your procedure findings, then the procedure report has been included in a sealed envelope for you to review at your convenience later.  YOU SHOULD EXPECT: Some feelings of bloating in the abdomen. Passage of more gas than usual.  Walking can help get rid of the air that was put into your GI tract during the procedure and reduce the bloating. If you had a lower endoscopy (such as a colonoscopy or flexible sigmoidoscopy) you may notice spotting of blood in your stool or on the toilet paper. If you underwent a bowel prep for your procedure, you may not have a normal bowel movement for a few days.  Please Note:  You might notice some irritation and congestion in your nose or some drainage.  This is from the oxygen used during your procedure.  There is no need for concern and it should clear up in a day or so.  SYMPTOMS TO REPORT IMMEDIATELY:   Following lower endoscopy (colonoscopy or flexible sigmoidoscopy):  Excessive amounts of blood in the stool  Significant tenderness or worsening of abdominal pains  Swelling of the abdomen that is new, acute  Fever of 100F or higher  For urgent or emergent issues, a gastroenterologist can be reached at any hour by calling [phone].   DIET: Your first meal following the procedure should be a small meal and then it is ok to progress to your normal diet. Heavy or fried foods are harder to digest and may make you feel nauseous or bloated.  Likewise, meals heavy in dairy and vegetables can increase bloating.  Drink plenty of fluids but you should avoid alcoholic beverages for 24  hours.  ACTIVITY:  You should plan to take it easy for the rest of today and you should NOT DRIVE or use heavy machinery until tomorrow (because of the sedation medicines used during the test).    FOLLOW UP: Our staff will call the number listed on your records the next business day following your procedure to check on you and address any questions or concerns that you may have regarding the information given to you following your procedure. If we do not reach you, we will leave a message.  However, if you are feeling well and you are not experiencing any problems, there is no need to return our call.  We will assume that you have returned to your regular daily activities without incident.  If any biopsies were taken you will be contacted by phone or by letter within the next 1-3 weeks.  Please call us at [phone] if you have not heard about the biopsies in 3 weeks.    SIGNATURES/CONFIDENTIALITY: You and/or your care partner have signed paperwork which will be entered into your electronic medical record.  These signatures attest to the fact that that the information above on your After Visit Summary has been reviewed and is understood.  Full responsibility of the confidentiality of this discharge information lies with you and/or your care-partner.     Handouts were given to your care partner on polyps, diverticulosis, and a high fiber diet with liberal fluid intake. Please hold aspirin  and all NSAIDS for 2 weeks. You may resume your other current medications today. Await biopsy results. Please call if any questions or concerns.

## 2016-01-29 NOTE — Progress Notes (Signed)
1003 documented in error.

## 2016-01-29 NOTE — Progress Notes (Signed)
Called to room to assist during endoscopic procedure.  Patient ID and intended procedure confirmed with present staff. Received instructions for my participation in the procedure from the performing physician.  

## 2016-01-29 NOTE — Progress Notes (Signed)
Bruise noted to  Right hand upon admission.

## 2016-01-29 NOTE — Progress Notes (Signed)
No problems noted in the recovery room. maw 

## 2016-02-01 ENCOUNTER — Telehealth: Payer: Self-pay

## 2016-02-01 NOTE — Telephone Encounter (Signed)
  Follow up Call-  Call back number 01/29/2016  Post procedure Call Back phone  # [phone]  Permission to leave phone message Yes     Patient questions:  Do you have a fever, pain , or abdominal swelling? No. Pain Score  0 *  Have you tolerated food without any problems? Yes.    Have you been able to return to your normal activities? Yes.    Do you have any questions about your discharge instructions: Diet   No. Medications  No. Follow up visit  No.  Do you have questions or concerns about your Care? No.  Actions: * If pain score is 4 or above: No action needed, pain <4.

## 2016-02-03 ENCOUNTER — Encounter: Payer: Self-pay | Admitting: Gastroenterology

## 2016-02-10 ENCOUNTER — Telehealth: Payer: Self-pay | Admitting: Gastroenterology

## 2016-02-10 NOTE — Telephone Encounter (Signed)
Spoke with patient and reviewed results with her and discussed recall in 5 years.

## 2016-03-07 DIAGNOSIS — Z1231 Encounter for screening mammogram for malignant neoplasm of breast: Secondary | ICD-10-CM | POA: Diagnosis not present

## 2016-04-11 DIAGNOSIS — H2513 Age-related nuclear cataract, bilateral: Secondary | ICD-10-CM | POA: Diagnosis not present

## 2016-05-30 DIAGNOSIS — H43393 Other vitreous opacities, bilateral: Secondary | ICD-10-CM | POA: Diagnosis not present

## 2016-05-30 DIAGNOSIS — H43811 Vitreous degeneration, right eye: Secondary | ICD-10-CM | POA: Diagnosis not present

## 2016-06-30 DIAGNOSIS — M79662 Pain in left lower leg: Secondary | ICD-10-CM | POA: Diagnosis not present

## 2016-06-30 DIAGNOSIS — S8012XA Contusion of left lower leg, initial encounter: Secondary | ICD-10-CM | POA: Diagnosis not present

## 2016-07-11 DIAGNOSIS — D3132 Benign neoplasm of left choroid: Secondary | ICD-10-CM | POA: Diagnosis not present

## 2016-07-11 DIAGNOSIS — H2513 Age-related nuclear cataract, bilateral: Secondary | ICD-10-CM | POA: Diagnosis not present

## 2016-07-11 DIAGNOSIS — H43813 Vitreous degeneration, bilateral: Secondary | ICD-10-CM | POA: Diagnosis not present

## 2016-07-18 DIAGNOSIS — Z6823 Body mass index (BMI) 23.0-23.9, adult: Secondary | ICD-10-CM | POA: Diagnosis not present

## 2016-07-18 DIAGNOSIS — N760 Acute vaginitis: Secondary | ICD-10-CM | POA: Diagnosis not present

## 2016-07-18 DIAGNOSIS — Z01419 Encounter for gynecological examination (general) (routine) without abnormal findings: Secondary | ICD-10-CM | POA: Diagnosis not present

## 2016-07-18 DIAGNOSIS — N390 Urinary tract infection, site not specified: Secondary | ICD-10-CM | POA: Diagnosis not present

## 2016-08-31 DIAGNOSIS — Z23 Encounter for immunization: Secondary | ICD-10-CM | POA: Diagnosis not present

## 2016-10-18 DIAGNOSIS — N952 Postmenopausal atrophic vaginitis: Secondary | ICD-10-CM | POA: Diagnosis not present

## 2016-10-18 DIAGNOSIS — N390 Urinary tract infection, site not specified: Secondary | ICD-10-CM | POA: Diagnosis not present

## 2017-03-24 DIAGNOSIS — Z1231 Encounter for screening mammogram for malignant neoplasm of breast: Secondary | ICD-10-CM | POA: Diagnosis not present

## 2017-03-31 DIAGNOSIS — E784 Other hyperlipidemia: Secondary | ICD-10-CM | POA: Diagnosis not present

## 2017-03-31 DIAGNOSIS — Z0000 Encounter for general adult medical examination without abnormal findings: Secondary | ICD-10-CM | POA: Diagnosis not present

## 2017-04-07 DIAGNOSIS — Z6823 Body mass index (BMI) 23.0-23.9, adult: Secondary | ICD-10-CM | POA: Diagnosis not present

## 2017-04-07 DIAGNOSIS — Z0000 Encounter for general adult medical examination without abnormal findings: Secondary | ICD-10-CM | POA: Diagnosis not present

## 2017-04-07 DIAGNOSIS — K219 Gastro-esophageal reflux disease without esophagitis: Secondary | ICD-10-CM | POA: Diagnosis not present

## 2017-04-07 DIAGNOSIS — Z1389 Encounter for screening for other disorder: Secondary | ICD-10-CM | POA: Diagnosis not present

## 2017-04-07 DIAGNOSIS — M1990 Unspecified osteoarthritis, unspecified site: Secondary | ICD-10-CM | POA: Diagnosis not present

## 2017-04-07 DIAGNOSIS — M859 Disorder of bone density and structure, unspecified: Secondary | ICD-10-CM | POA: Diagnosis not present

## 2017-04-07 DIAGNOSIS — N3281 Overactive bladder: Secondary | ICD-10-CM | POA: Diagnosis not present

## 2017-04-07 DIAGNOSIS — E784 Other hyperlipidemia: Secondary | ICD-10-CM | POA: Diagnosis not present

## 2017-04-07 DIAGNOSIS — J302 Other seasonal allergic rhinitis: Secondary | ICD-10-CM | POA: Diagnosis not present

## 2017-04-14 DIAGNOSIS — H2513 Age-related nuclear cataract, bilateral: Secondary | ICD-10-CM | POA: Diagnosis not present

## 2017-04-14 DIAGNOSIS — H40033 Anatomical narrow angle, bilateral: Secondary | ICD-10-CM | POA: Diagnosis not present

## 2017-04-14 DIAGNOSIS — D3132 Benign neoplasm of left choroid: Secondary | ICD-10-CM | POA: Diagnosis not present

## 2017-05-03 DIAGNOSIS — M7062 Trochanteric bursitis, left hip: Secondary | ICD-10-CM | POA: Diagnosis not present

## 2017-05-03 DIAGNOSIS — M7061 Trochanteric bursitis, right hip: Secondary | ICD-10-CM | POA: Diagnosis not present

## 2017-07-24 DIAGNOSIS — Z01419 Encounter for gynecological examination (general) (routine) without abnormal findings: Secondary | ICD-10-CM | POA: Diagnosis not present

## 2017-07-24 DIAGNOSIS — N952 Postmenopausal atrophic vaginitis: Secondary | ICD-10-CM | POA: Diagnosis not present

## 2017-07-24 DIAGNOSIS — Z6825 Body mass index (BMI) 25.0-25.9, adult: Secondary | ICD-10-CM | POA: Diagnosis not present

## 2017-07-28 DIAGNOSIS — M8588 Other specified disorders of bone density and structure, other site: Secondary | ICD-10-CM | POA: Diagnosis not present

## 2017-07-28 DIAGNOSIS — N958 Other specified menopausal and perimenopausal disorders: Secondary | ICD-10-CM | POA: Diagnosis not present

## 2017-08-14 DIAGNOSIS — H2511 Age-related nuclear cataract, right eye: Secondary | ICD-10-CM | POA: Diagnosis not present

## 2017-08-14 DIAGNOSIS — H2512 Age-related nuclear cataract, left eye: Secondary | ICD-10-CM | POA: Diagnosis not present

## 2017-08-14 DIAGNOSIS — D3132 Benign neoplasm of left choroid: Secondary | ICD-10-CM | POA: Diagnosis not present

## 2017-08-14 DIAGNOSIS — H04123 Dry eye syndrome of bilateral lacrimal glands: Secondary | ICD-10-CM | POA: Diagnosis not present

## 2017-08-19 ENCOUNTER — Encounter (HOSPITAL_BASED_OUTPATIENT_CLINIC_OR_DEPARTMENT_OTHER): Payer: Self-pay | Admitting: *Deleted

## 2017-08-19 ENCOUNTER — Emergency Department (HOSPITAL_BASED_OUTPATIENT_CLINIC_OR_DEPARTMENT_OTHER)
Admission: EM | Admit: 2017-08-19 | Discharge: 2017-08-19 | Disposition: A | Discharge status: Home / Self Care | Payer: Medicare Other | Attending: Emergency Medicine | Admitting: Emergency Medicine

## 2017-08-19 DIAGNOSIS — S99921A Unspecified injury of right foot, initial encounter: Secondary | ICD-10-CM | POA: Diagnosis not present

## 2017-08-19 DIAGNOSIS — Z7982 Long term (current) use of aspirin: Secondary | ICD-10-CM | POA: Diagnosis not present

## 2017-08-19 DIAGNOSIS — Y998 Other external cause status: Secondary | ICD-10-CM | POA: Diagnosis not present

## 2017-08-19 DIAGNOSIS — Y9222 Religious institution as the place of occurrence of the external cause: Secondary | ICD-10-CM | POA: Insufficient documentation

## 2017-08-19 DIAGNOSIS — W228XXA Striking against or struck by other objects, initial encounter: Secondary | ICD-10-CM | POA: Diagnosis not present

## 2017-08-19 DIAGNOSIS — E785 Hyperlipidemia, unspecified: Secondary | ICD-10-CM | POA: Insufficient documentation

## 2017-08-19 DIAGNOSIS — Z87891 Personal history of nicotine dependence: Secondary | ICD-10-CM | POA: Diagnosis not present

## 2017-08-19 DIAGNOSIS — Y9301 Activity, walking, marching and hiking: Secondary | ICD-10-CM | POA: Diagnosis not present

## 2017-08-19 DIAGNOSIS — Z79899 Other long term (current) drug therapy: Secondary | ICD-10-CM | POA: Diagnosis not present

## 2017-08-19 NOTE — ED Triage Notes (Signed)
Pt reports being at church about 2 hours ago and a cart rolled over her left great toe. C/o pain in the left toenail area.

## 2017-08-19 NOTE — ED Notes (Signed)
Wounds cleansed. Bacitracin applied with gauze and wrapped in kerlix to L great toe. Bacitracin and bandaid applied to L 4th toe.

## 2017-08-19 NOTE — Discharge Instructions (Signed)
Keep area clean with soap and warm water.  Can use topical neosporin at home and keep area bandaged if you like. Follow-up with your primary care doctor. Return here for any new/worsening symptoms.
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
## 2017-08-23 DIAGNOSIS — H25812 Combined forms of age-related cataract, left eye: Secondary | ICD-10-CM | POA: Diagnosis not present

## 2017-08-23 DIAGNOSIS — H2512 Age-related nuclear cataract, left eye: Secondary | ICD-10-CM | POA: Diagnosis not present

## 2017-08-30 DIAGNOSIS — Z23 Encounter for immunization: Secondary | ICD-10-CM | POA: Diagnosis not present

## 2017-09-28 DIAGNOSIS — H2511 Age-related nuclear cataract, right eye: Secondary | ICD-10-CM | POA: Diagnosis not present

## 2017-10-04 DIAGNOSIS — H25811 Combined forms of age-related cataract, right eye: Secondary | ICD-10-CM | POA: Diagnosis not present

## 2017-10-04 DIAGNOSIS — H2511 Age-related nuclear cataract, right eye: Secondary | ICD-10-CM | POA: Diagnosis not present

## 2017-10-20 DIAGNOSIS — M79609 Pain in unspecified limb: Secondary | ICD-10-CM | POA: Diagnosis not present

## 2017-11-10 ENCOUNTER — Other Ambulatory Visit: Payer: Self-pay | Admitting: Internal Medicine

## 2017-11-10 DIAGNOSIS — Z6824 Body mass index (BMI) 24.0-24.9, adult: Secondary | ICD-10-CM | POA: Diagnosis not present

## 2017-11-10 DIAGNOSIS — M5416 Radiculopathy, lumbar region: Secondary | ICD-10-CM

## 2017-11-10 DIAGNOSIS — M79662 Pain in left lower leg: Secondary | ICD-10-CM

## 2017-11-10 DIAGNOSIS — M79605 Pain in left leg: Secondary | ICD-10-CM | POA: Diagnosis not present

## 2017-11-12 ENCOUNTER — Ambulatory Visit
Admission: RE | Admit: 2017-11-12 | Discharge: 2017-11-12 | Disposition: A | Discharge status: Home / Self Care | Payer: 59 | Source: Ambulatory Visit | Attending: Internal Medicine | Admitting: Internal Medicine

## 2017-11-12 DIAGNOSIS — M48061 Spinal stenosis, lumbar region without neurogenic claudication: Secondary | ICD-10-CM | POA: Diagnosis not present

## 2017-11-12 DIAGNOSIS — M5416 Radiculopathy, lumbar region: Secondary | ICD-10-CM

## 2017-11-12 DIAGNOSIS — M79662 Pain in left lower leg: Secondary | ICD-10-CM

## 2018-03-30 DIAGNOSIS — Z1231 Encounter for screening mammogram for malignant neoplasm of breast: Secondary | ICD-10-CM | POA: Diagnosis not present

## 2018-04-20 DIAGNOSIS — E7849 Other hyperlipidemia: Secondary | ICD-10-CM | POA: Diagnosis not present

## 2018-04-20 DIAGNOSIS — R82998 Other abnormal findings in urine: Secondary | ICD-10-CM | POA: Diagnosis not present

## 2018-04-20 DIAGNOSIS — M859 Disorder of bone density and structure, unspecified: Secondary | ICD-10-CM | POA: Diagnosis not present

## 2018-04-25 DIAGNOSIS — N390 Urinary tract infection, site not specified: Secondary | ICD-10-CM | POA: Diagnosis not present

## 2018-04-25 DIAGNOSIS — M5380 Other specified dorsopathies, site unspecified: Secondary | ICD-10-CM | POA: Diagnosis not present

## 2018-04-25 DIAGNOSIS — M1990 Unspecified osteoarthritis, unspecified site: Secondary | ICD-10-CM | POA: Diagnosis not present

## 2018-04-25 DIAGNOSIS — E7849 Other hyperlipidemia: Secondary | ICD-10-CM | POA: Diagnosis not present

## 2018-04-25 DIAGNOSIS — M859 Disorder of bone density and structure, unspecified: Secondary | ICD-10-CM | POA: Diagnosis not present

## 2018-04-25 DIAGNOSIS — Z0000 Encounter for general adult medical examination without abnormal findings: Secondary | ICD-10-CM | POA: Diagnosis not present

## 2018-04-25 DIAGNOSIS — Z1389 Encounter for screening for other disorder: Secondary | ICD-10-CM | POA: Diagnosis not present

## 2018-04-25 DIAGNOSIS — F439 Reaction to severe stress, unspecified: Secondary | ICD-10-CM | POA: Diagnosis not present

## 2018-04-25 DIAGNOSIS — N3281 Overactive bladder: Secondary | ICD-10-CM | POA: Diagnosis not present

## 2018-04-25 DIAGNOSIS — K219 Gastro-esophageal reflux disease without esophagitis: Secondary | ICD-10-CM | POA: Diagnosis not present

## 2018-04-25 DIAGNOSIS — Z6824 Body mass index (BMI) 24.0-24.9, adult: Secondary | ICD-10-CM | POA: Diagnosis not present

## 2018-05-01 DIAGNOSIS — Z1212 Encounter for screening for malignant neoplasm of rectum: Secondary | ICD-10-CM | POA: Diagnosis not present

## 2018-05-04 DIAGNOSIS — H26493 Other secondary cataract, bilateral: Secondary | ICD-10-CM | POA: Diagnosis not present

## 2018-05-04 DIAGNOSIS — H04123 Dry eye syndrome of bilateral lacrimal glands: Secondary | ICD-10-CM | POA: Diagnosis not present

## 2018-05-04 DIAGNOSIS — Z961 Presence of intraocular lens: Secondary | ICD-10-CM | POA: Diagnosis not present

## 2018-05-07 DIAGNOSIS — M859 Disorder of bone density and structure, unspecified: Secondary | ICD-10-CM | POA: Diagnosis not present

## 2018-05-09 ENCOUNTER — Ambulatory Visit (INDEPENDENT_AMBULATORY_CARE_PROVIDER_SITE_OTHER): Payer: Medicare Other | Admitting: Podiatry

## 2018-05-09 ENCOUNTER — Other Ambulatory Visit: Payer: Self-pay | Admitting: Podiatry

## 2018-05-09 ENCOUNTER — Ambulatory Visit (INDEPENDENT_AMBULATORY_CARE_PROVIDER_SITE_OTHER): Payer: Medicare Other

## 2018-05-09 ENCOUNTER — Other Ambulatory Visit: Payer: Self-pay

## 2018-05-09 ENCOUNTER — Encounter: Payer: Self-pay | Admitting: Podiatry

## 2018-05-09 VITALS — BP 145/79 | HR 75

## 2018-05-09 DIAGNOSIS — B351 Tinea unguium: Secondary | ICD-10-CM

## 2018-05-09 DIAGNOSIS — L600 Ingrowing nail: Secondary | ICD-10-CM | POA: Diagnosis not present

## 2018-05-09 DIAGNOSIS — M79672 Pain in left foot: Secondary | ICD-10-CM

## 2018-05-09 NOTE — Patient Instructions (Signed)

## 2018-05-09 NOTE — Progress Notes (Signed)
Subjective:   Patient ID: Andrea Carroll, female   DOB: 72 y.o.   MRN: [id number]   HPI Patient presents stating I have a damaged left hallux nail and it really hurt in the corner and is been going on for over 6 months.  Patient states she is tried to trim and soak it without relief and it seems like it is getting worse as time goes on.  Patient does not smoke and likes to be active   Review of Systems  All other systems reviewed and are negative.       Objective:  Physical Exam  Constitutional: She appears well-developed and well-nourished.  Cardiovascular: Intact distal pulses.  Pulmonary/Chest: Effort normal.  Musculoskeletal: Normal range of motion.  Neurological: She is alert.  Skin: Skin is warm.  Nursing note and vitals reviewed.   Neurovascular status intact muscle strength was adequate range of motion within normal limits with patient found to have a thickened damaged hallux nail left that is painful and is painful mostly in the lateral corner.  It is incurvated in this corner and makes it hard for her to be comfortable with gait and the nail itself is also damaged.  Patient has good digital perfusion and is well oriented x3     Assessment:  Ingrown toenail deformity left hallux lateral border and thickened hallux nail left that is dystrophic     Plan:  H&P condition reviewed and I recommended removal of the most of the nailbed and permanent removal of the lateral corner due to the ingrown component.  Explained procedure and risk and patient signed consent form and today I infiltrated the left hallux 60 mg Xylocaine Marcaine mixture I removed the lateral border exposed matrix and applied phenol 3 applications 30 seconds followed by alcohol lavage and then I removed the distal two thirds of the nailbed left that was thickened and abnormal.  Sterile dressing applied instructed on soaks and reappoint and patient understands ultimately she could lose the entire nail permanently

## 2018-06-29 DIAGNOSIS — M7062 Trochanteric bursitis, left hip: Secondary | ICD-10-CM | POA: Diagnosis not present

## 2018-06-29 DIAGNOSIS — M7061 Trochanteric bursitis, right hip: Secondary | ICD-10-CM | POA: Diagnosis not present

## 2018-07-10 ENCOUNTER — Telehealth: Payer: Self-pay | Admitting: Podiatry

## 2018-07-10 NOTE — Telephone Encounter (Signed)
Dr. Paulla Dolly removed a toenail back in June. I've got a few issues that I wanted to ask about. One is, there is a large lump of nail growing that's sort of sharp. I didn't know wether that needed to be checked? I still have a little bit of pain every once in a while with it. You can reach me at [phone]. Thank you. Bye.

## 2018-07-13 ENCOUNTER — Ambulatory Visit: Payer: 59 | Admitting: Podiatry

## 2018-07-16 ENCOUNTER — Ambulatory Visit (INDEPENDENT_AMBULATORY_CARE_PROVIDER_SITE_OTHER): Payer: Medicare Other | Admitting: Podiatry

## 2018-07-16 ENCOUNTER — Encounter: Payer: Self-pay | Admitting: Podiatry

## 2018-07-16 DIAGNOSIS — M79672 Pain in left foot: Secondary | ICD-10-CM | POA: Diagnosis not present

## 2018-07-16 DIAGNOSIS — L600 Ingrowing nail: Secondary | ICD-10-CM | POA: Diagnosis not present

## 2018-07-18 NOTE — Progress Notes (Signed)
Subjective:   Patient ID: Andrea Carroll, female   DOB: 71 y.o.   MRN: [id number]   HPI Patient presents stating that her nail has grown back somewhat thickened and she was just concerned and wanted it checked   ROS      Objective:  Physical Exam  Neurovascular status intact with thickened left hallux nail dorsal surface and there is moderate changes within the nailbed itself.  The area that I fix the ingrown does look good     Assessment:  Patient is found to have mild ingrown toenail of the nailbed itself where we had to remove it and allowing it to regrow with patient having slight incurvation of the left lateral border but it is growing normally at the current time     Plan:  I reviewed condition at this point using sterile sharp instrumentation I debrided the bed smooth bit and I do not think it will require anything else.  I am satisfied with the removal of the nail corner and think it will be uneventful

## 2018-07-27 DIAGNOSIS — Z01419 Encounter for gynecological examination (general) (routine) without abnormal findings: Secondary | ICD-10-CM | POA: Diagnosis not present

## 2018-07-27 DIAGNOSIS — Z6824 Body mass index (BMI) 24.0-24.9, adult: Secondary | ICD-10-CM | POA: Diagnosis not present

## 2018-08-21 DIAGNOSIS — Z23 Encounter for immunization: Secondary | ICD-10-CM | POA: Diagnosis not present

## 2019-02-06 ENCOUNTER — Other Ambulatory Visit: Payer: Self-pay

## 2019-02-06 ENCOUNTER — Other Ambulatory Visit: Payer: Self-pay | Admitting: Podiatrist

## 2019-02-06 ENCOUNTER — Ambulatory Visit (INDEPENDENT_AMBULATORY_CARE_PROVIDER_SITE_OTHER): Payer: Medicare Other | Admitting: Podiatrist

## 2019-02-06 ENCOUNTER — Ambulatory Visit (INDEPENDENT_AMBULATORY_CARE_PROVIDER_SITE_OTHER): Payer: Medicare Other

## 2019-02-06 DIAGNOSIS — M2042 Other hammer toe(s) (acquired), left foot: Secondary | ICD-10-CM

## 2019-02-06 DIAGNOSIS — M79672 Pain in left foot: Secondary | ICD-10-CM

## 2019-02-06 DIAGNOSIS — L603 Nail dystrophy: Secondary | ICD-10-CM

## 2019-02-06 MED ORDER — LIDOCAINE-PRILOCAINE 2.5-2.5 % EX CREA: TOPICAL_CREAM | Freq: Once | CUTANEOUS | Status: AC

## 2019-02-08 ENCOUNTER — Encounter: Payer: Self-pay | Admitting: Podiatrist

## 2019-02-08 NOTE — Progress Notes (Signed)
  Chief Complaint  Patient presents with  . Nail Problem    Left 1st nail is growing sideays and entire nail is painful. Pt states no drainage, pt denies fever/nausea/vomiting/chills.  . Toe Pain    Left 1st and 2nd toe pain, vacuum cleaner fell on toes 48mo ago, still painful.  . Nail Problem    Left 4th toenail was discolored and then fell off.     HPI: Patient is 73 y.o. female who presents today for pain in toenails of the left foot.  She states the left hallux nail is growing sideways and the entire nail is painful.  The first and second toenails were hit by a vacuum cleaner when it fell and they are painful, and the left fourth toenail has fallen off and is discolored.   Review of Systems  DATA OBTAINED: from patient  GENERAL: Feels well no fevers, no fatigue, no changes in appetite SKIN: No itching, no rashes, no open wounds EYES: No eye pain,no redness, no discharge EARS: No earache,no ringing of ears, NOSE: No congestion, no drainage, no bleeding  MOUTH/THROAT: No mouth pain, No sore throat, No difficulty chewing or swallowing  RESPIRATORY: No cough, no wheezing, no SOB CARDIAC: No chest pain,no heart palpitations, GI: No abdominal pain, No Nausea, no vomiting, no diarrhea, no heartburn or no reflux  GU: No dysuria, no increased frequency or urgency MUSCULOSKELETAL: No unrelieved bone/joint pain,  NEUROLOGIC: Awake, alert, appropriate to situation, No change in mental status. PSYCHIATRIC: No overt anxiety or sadness.No behavior issue.      Physical Exam  GENERAL APPEARANCE: Alert, conversant. Appropriately groomed. No acute distress.   VASCULAR: Pedal pulses palpable DP and PT bilateral.  Capillary refill time is immediate to all digits,  Proximal to distal cooling it warm to warm.  Digital hair growth is present bilateral   NEUROLOGIC: sensation is intact epicritically and protectively to 5.07 monofilament at 5/5 sites bilateral.  Light touch is intact bilateral,  vibratory sensation intact bilateral, achilles tendon reflex is intact bilateral.   MUSCULOSKELETAL: acceptable muscle strength, tone and stability bilateral.  Intrinsic muscluature intact bilateral.  Range of motion at ankle and first MPJ is normal bilateral.   DERMATOLOGIC: skin is warm, supple, and dry.  No open lesions noted.  No interdigital maceration noted bilateral.   Left hallux nail is growing laterally.  It does appear thickened, and likely damaged in the past.  The left fourth toenail does have discoloration remainder of the toenails do not appear to be damaged.     Assessment   Contusion toenails of left foot, ingrown left hallux nail  Plan  Recommended smoothing the left hallux nail and instructed the patient to use toll cyclin on the nail in hopes that this will help it grow normally.  There is not enough nail to sample although I did discuss that we could try laser if there are any fungal elements present this might help.  She will try the toe cyclin for 3 to 4 months and will call if there is no improvement.  Did discuss that I could permanently remove the hallux nail however she would like to wait.

## 2019-04-26 DIAGNOSIS — M859 Disorder of bone density and structure, unspecified: Secondary | ICD-10-CM | POA: Diagnosis not present

## 2019-04-26 DIAGNOSIS — E7849 Other hyperlipidemia: Secondary | ICD-10-CM | POA: Diagnosis not present

## 2019-04-26 DIAGNOSIS — R82998 Other abnormal findings in urine: Secondary | ICD-10-CM | POA: Diagnosis not present

## 2019-05-03 DIAGNOSIS — Z0000 Encounter for general adult medical examination without abnormal findings: Secondary | ICD-10-CM | POA: Diagnosis not present

## 2019-05-03 DIAGNOSIS — E785 Hyperlipidemia, unspecified: Secondary | ICD-10-CM | POA: Diagnosis not present

## 2019-05-03 DIAGNOSIS — F439 Reaction to severe stress, unspecified: Secondary | ICD-10-CM | POA: Diagnosis not present

## 2019-05-03 DIAGNOSIS — M8580 Other specified disorders of bone density and structure, unspecified site: Secondary | ICD-10-CM | POA: Diagnosis not present

## 2019-05-03 DIAGNOSIS — M1990 Unspecified osteoarthritis, unspecified site: Secondary | ICD-10-CM | POA: Diagnosis not present

## 2019-05-03 DIAGNOSIS — J302 Other seasonal allergic rhinitis: Secondary | ICD-10-CM | POA: Diagnosis not present

## 2019-05-03 DIAGNOSIS — M67442 Ganglion, left hand: Secondary | ICD-10-CM | POA: Diagnosis not present

## 2019-05-03 DIAGNOSIS — N3281 Overactive bladder: Secondary | ICD-10-CM | POA: Diagnosis not present

## 2019-05-03 DIAGNOSIS — K219 Gastro-esophageal reflux disease without esophagitis: Secondary | ICD-10-CM | POA: Diagnosis not present

## 2019-05-03 DIAGNOSIS — Z1331 Encounter for screening for depression: Secondary | ICD-10-CM | POA: Diagnosis not present

## 2019-05-03 DIAGNOSIS — N390 Urinary tract infection, site not specified: Secondary | ICD-10-CM | POA: Diagnosis not present

## 2019-05-03 DIAGNOSIS — M5380 Other specified dorsopathies, site unspecified: Secondary | ICD-10-CM | POA: Diagnosis not present

## 2019-05-06 DIAGNOSIS — Z961 Presence of intraocular lens: Secondary | ICD-10-CM | POA: Diagnosis not present

## 2019-05-06 DIAGNOSIS — H04123 Dry eye syndrome of bilateral lacrimal glands: Secondary | ICD-10-CM | POA: Diagnosis not present

## 2019-05-06 DIAGNOSIS — D23111 Other benign neoplasm of skin of right upper eyelid, including canthus: Secondary | ICD-10-CM | POA: Diagnosis not present

## 2019-05-06 DIAGNOSIS — H26493 Other secondary cataract, bilateral: Secondary | ICD-10-CM | POA: Diagnosis not present

## 2019-05-08 ENCOUNTER — Ambulatory Visit: Payer: 59 | Admitting: Podiatrist

## 2019-06-12 DIAGNOSIS — Z1231 Encounter for screening mammogram for malignant neoplasm of breast: Secondary | ICD-10-CM | POA: Diagnosis not present

## 2019-07-05 DIAGNOSIS — R921 Mammographic calcification found on diagnostic imaging of breast: Secondary | ICD-10-CM | POA: Diagnosis not present

## 2019-07-05 DIAGNOSIS — R928 Other abnormal and inconclusive findings on diagnostic imaging of breast: Secondary | ICD-10-CM | POA: Diagnosis not present

## 2019-07-24 DIAGNOSIS — S91332A Puncture wound without foreign body, left foot, initial encounter: Secondary | ICD-10-CM | POA: Diagnosis not present

## 2019-08-23 DIAGNOSIS — Z23 Encounter for immunization: Secondary | ICD-10-CM | POA: Diagnosis not present

## 2020-01-08 DIAGNOSIS — R921 Mammographic calcification found on diagnostic imaging of breast: Secondary | ICD-10-CM | POA: Diagnosis not present

## 2020-05-05 DIAGNOSIS — M859 Disorder of bone density and structure, unspecified: Secondary | ICD-10-CM | POA: Diagnosis not present

## 2020-05-05 DIAGNOSIS — E7849 Other hyperlipidemia: Secondary | ICD-10-CM | POA: Diagnosis not present

## 2020-05-11 DIAGNOSIS — K219 Gastro-esophageal reflux disease without esophagitis: Secondary | ICD-10-CM | POA: Diagnosis not present

## 2020-05-11 DIAGNOSIS — M8580 Other specified disorders of bone density and structure, unspecified site: Secondary | ICD-10-CM | POA: Diagnosis not present

## 2020-05-11 DIAGNOSIS — Z0000 Encounter for general adult medical examination without abnormal findings: Secondary | ICD-10-CM | POA: Diagnosis not present

## 2020-05-11 DIAGNOSIS — Z1331 Encounter for screening for depression: Secondary | ICD-10-CM | POA: Diagnosis not present

## 2020-05-11 DIAGNOSIS — F439 Reaction to severe stress, unspecified: Secondary | ICD-10-CM | POA: Diagnosis not present

## 2020-05-11 DIAGNOSIS — E785 Hyperlipidemia, unspecified: Secondary | ICD-10-CM | POA: Diagnosis not present

## 2020-05-11 DIAGNOSIS — M5380 Other specified dorsopathies, site unspecified: Secondary | ICD-10-CM | POA: Diagnosis not present

## 2020-05-11 DIAGNOSIS — R82998 Other abnormal findings in urine: Secondary | ICD-10-CM | POA: Diagnosis not present

## 2020-05-11 DIAGNOSIS — M1990 Unspecified osteoarthritis, unspecified site: Secondary | ICD-10-CM | POA: Diagnosis not present

## 2020-07-08 DIAGNOSIS — D1801 Hemangioma of skin and subcutaneous tissue: Secondary | ICD-10-CM | POA: Diagnosis not present

## 2020-07-08 DIAGNOSIS — L820 Inflamed seborrheic keratosis: Secondary | ICD-10-CM | POA: Diagnosis not present

## 2020-07-08 DIAGNOSIS — D485 Neoplasm of uncertain behavior of skin: Secondary | ICD-10-CM | POA: Diagnosis not present

## 2020-07-08 DIAGNOSIS — D2262 Melanocytic nevi of left upper limb, including shoulder: Secondary | ICD-10-CM | POA: Diagnosis not present

## 2020-07-08 DIAGNOSIS — D2261 Melanocytic nevi of right upper limb, including shoulder: Secondary | ICD-10-CM | POA: Diagnosis not present

## 2020-07-08 DIAGNOSIS — L821 Other seborrheic keratosis: Secondary | ICD-10-CM | POA: Diagnosis not present

## 2020-07-08 DIAGNOSIS — C44519 Basal cell carcinoma of skin of other part of trunk: Secondary | ICD-10-CM | POA: Diagnosis not present

## 2020-07-08 DIAGNOSIS — D225 Melanocytic nevi of trunk: Secondary | ICD-10-CM | POA: Diagnosis not present

## 2020-07-08 DIAGNOSIS — C44619 Basal cell carcinoma of skin of left upper limb, including shoulder: Secondary | ICD-10-CM | POA: Diagnosis not present

## 2020-07-08 DIAGNOSIS — D220 Melanocytic nevi of lip: Secondary | ICD-10-CM | POA: Diagnosis not present

## 2020-07-29 DIAGNOSIS — Z124 Encounter for screening for malignant neoplasm of cervix: Secondary | ICD-10-CM | POA: Diagnosis not present

## 2020-07-29 DIAGNOSIS — Z6824 Body mass index (BMI) 24.0-24.9, adult: Secondary | ICD-10-CM | POA: Diagnosis not present

## 2020-09-14 DIAGNOSIS — Z23 Encounter for immunization: Secondary | ICD-10-CM | POA: Diagnosis not present

## 2020-10-16 DIAGNOSIS — R928 Other abnormal and inconclusive findings on diagnostic imaging of breast: Secondary | ICD-10-CM | POA: Diagnosis not present

## 2020-10-16 DIAGNOSIS — R921 Mammographic calcification found on diagnostic imaging of breast: Secondary | ICD-10-CM | POA: Diagnosis not present

## 2020-10-27 ENCOUNTER — Emergency Department (HOSPITAL_BASED_OUTPATIENT_CLINIC_OR_DEPARTMENT_OTHER)
Admission: EM | Admit: 2020-10-27 | Discharge: 2020-10-27 | Disposition: A | Discharge status: Home / Self Care | Payer: Medicare Other | Attending: Emergency Medicine | Admitting: Emergency Medicine

## 2020-10-27 ENCOUNTER — Other Ambulatory Visit: Payer: Self-pay

## 2020-10-27 ENCOUNTER — Emergency Department (HOSPITAL_BASED_OUTPATIENT_CLINIC_OR_DEPARTMENT_OTHER): Payer: Medicare Other

## 2020-10-27 ENCOUNTER — Encounter (HOSPITAL_BASED_OUTPATIENT_CLINIC_OR_DEPARTMENT_OTHER): Payer: Self-pay | Admitting: *Deleted

## 2020-10-27 DIAGNOSIS — R110 Nausea: Secondary | ICD-10-CM | POA: Insufficient documentation

## 2020-10-27 DIAGNOSIS — R0789 Other chest pain: Secondary | ICD-10-CM | POA: Diagnosis not present

## 2020-10-27 DIAGNOSIS — R42 Dizziness and giddiness: Secondary | ICD-10-CM | POA: Insufficient documentation

## 2020-10-27 DIAGNOSIS — Z79899 Other long term (current) drug therapy: Secondary | ICD-10-CM | POA: Insufficient documentation

## 2020-10-27 DIAGNOSIS — Z87891 Personal history of nicotine dependence: Secondary | ICD-10-CM | POA: Diagnosis not present

## 2020-10-27 DIAGNOSIS — I10 Essential (primary) hypertension: Secondary | ICD-10-CM | POA: Diagnosis not present

## 2020-10-27 DIAGNOSIS — R0602 Shortness of breath: Secondary | ICD-10-CM | POA: Diagnosis not present

## 2020-10-27 DIAGNOSIS — R079 Chest pain, unspecified: Secondary | ICD-10-CM | POA: Diagnosis not present

## 2020-10-27 LAB — COMPREHENSIVE METABOLIC PANEL
ALT: 15 U/L (ref 0–44)
AST: 19 U/L (ref 15–41)
Albumin: 4 g/dL (ref 3.5–5.0)
Alkaline Phosphatase: 63 U/L (ref 38–126)
Anion gap: 10 (ref 5–15)
BUN: 12 mg/dL (ref 8–23)
CO2: 27 mmol/L (ref 22–32)
Calcium: 9.1 mg/dL (ref 8.9–10.3)
Chloride: 103 mmol/L (ref 98–111)
Creatinine, Ser: 0.53 mg/dL (ref 0.44–1.00)
GFR, Estimated: >60 mL/min (ref >60)
Glucose, Bld: 103 mg/dL — ABNORMAL HIGH (ref 70–99)
Potassium: 3.9 mmol/L (ref 3.5–5.1)
Sodium: 140 mmol/L (ref 135–145)
Total Bilirubin: 0.5 mg/dL (ref 0.3–1.2)
Total Protein: 7.2 g/dL (ref 6.5–8.1)

## 2020-10-27 LAB — CBC
HCT: 41.5 % (ref 36.0–46.0)
Hemoglobin: 13.9 g/dL (ref 12.0–15.0)
MCH: 30.3 pg (ref 26.0–34.0)
MCHC: 33.5 g/dL (ref 30.0–36.0)
MCV: 90.4 fL (ref 80.0–100.0)
Platelets: 317 10*3/uL (ref 150–400)
RBC: 4.59 MIL/uL (ref 3.87–5.11)
RDW: 11.9 % (ref 11.5–15.5)
WBC: 5.9 10*3/uL (ref 4.0–10.5)
nRBC: 0 % (ref 0.0–0.2)

## 2020-10-27 LAB — TROPONIN I (HIGH SENSITIVITY)
Troponin I (High Sensitivity): 6 ng/L (ref <18)
Troponin I (High Sensitivity): 6 ng/L (ref <18)

## 2020-10-27 LAB — D-DIMER, QUANTITATIVE: D-Dimer, Quant: 0.59 ug/mL-FEU — ABNORMAL HIGH (ref 0.00–0.50)

## 2020-10-27 IMAGING — DX DG CHEST 2V
2 series · 2 of 2 positions shown · non-contrast
Comparison: Chest radiographs [DATE].

CLINICAL DATA: Chest tightness. Additional history provided:
Patient reports chest tightness

EXAM:
CHEST - 2 VIEW

[chest pa]
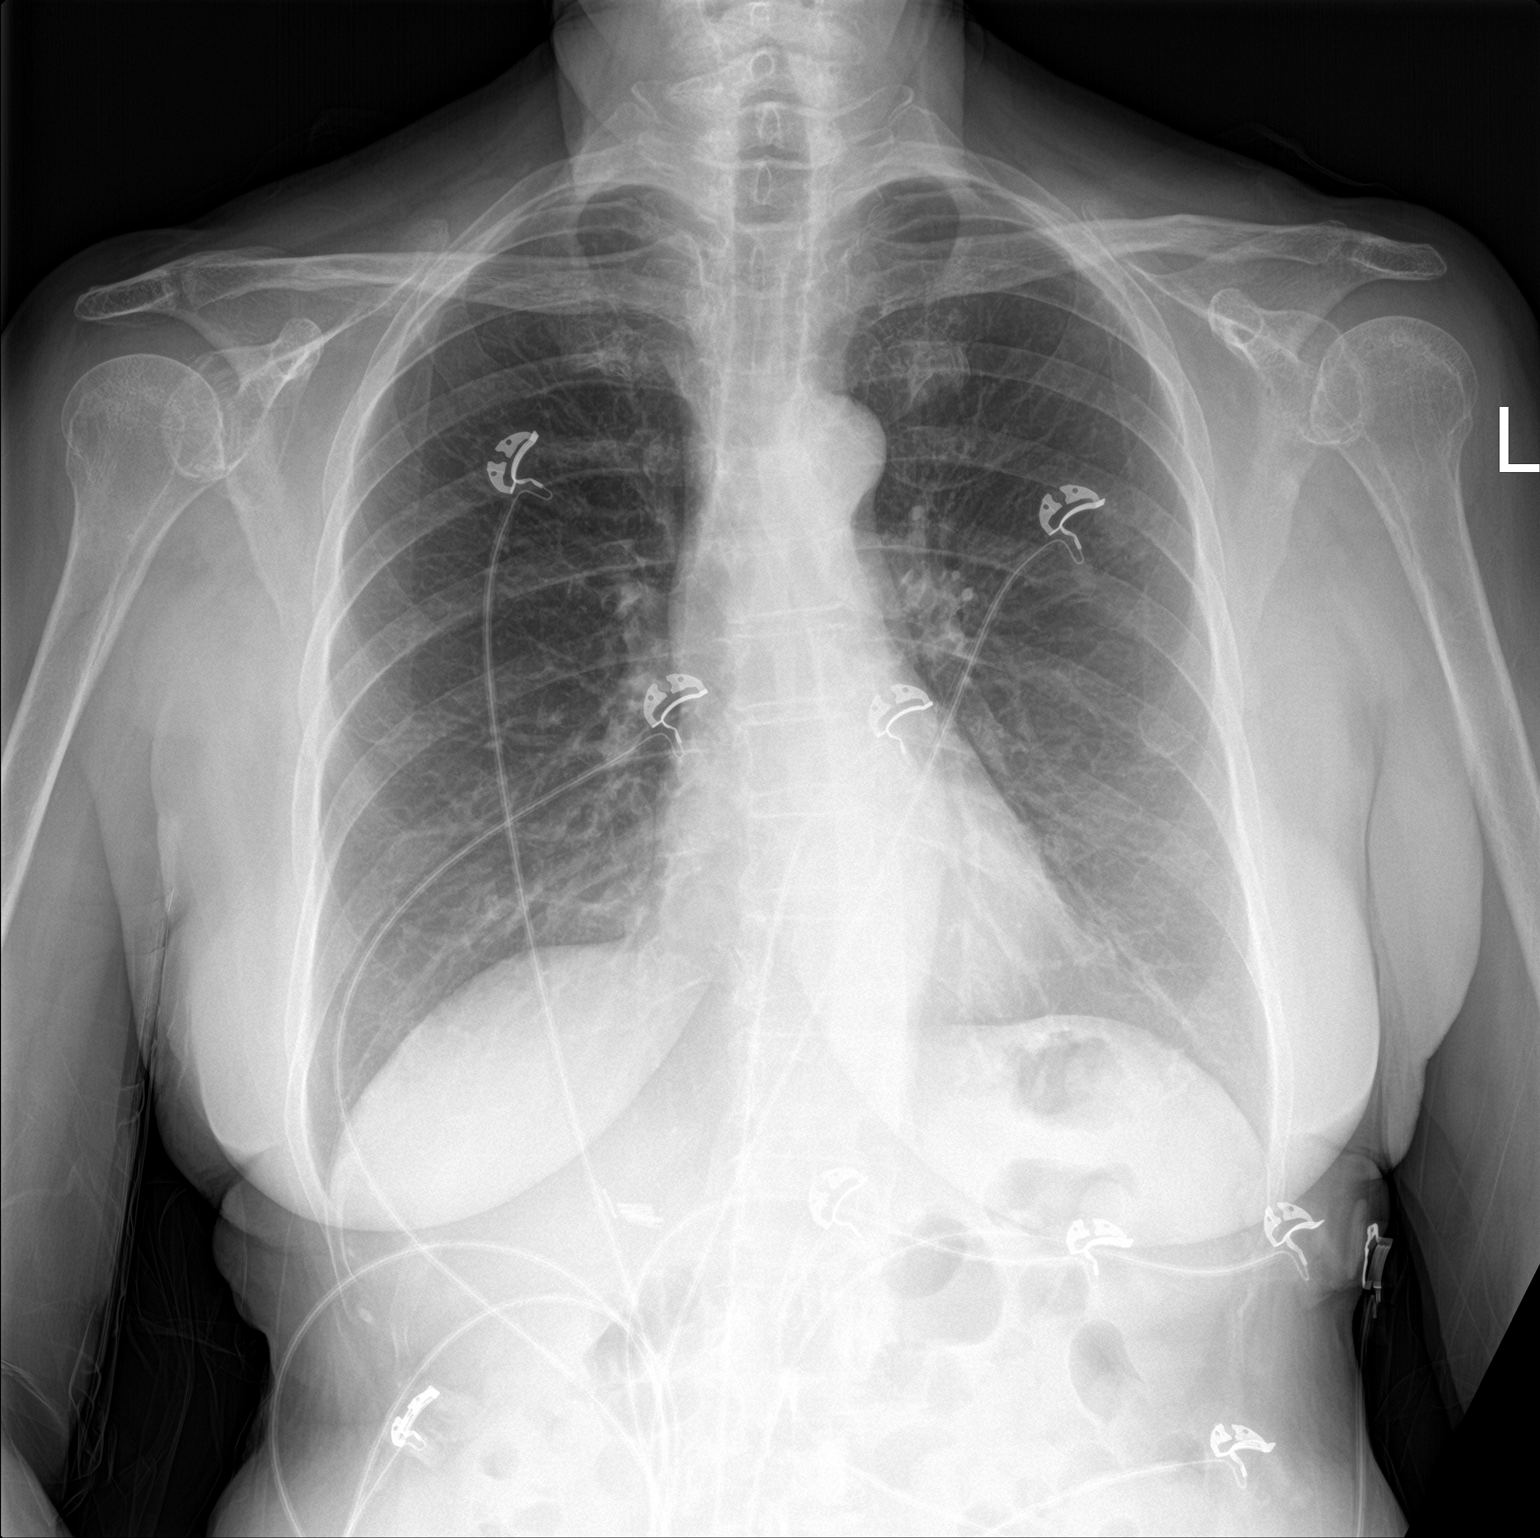

[chest lat]
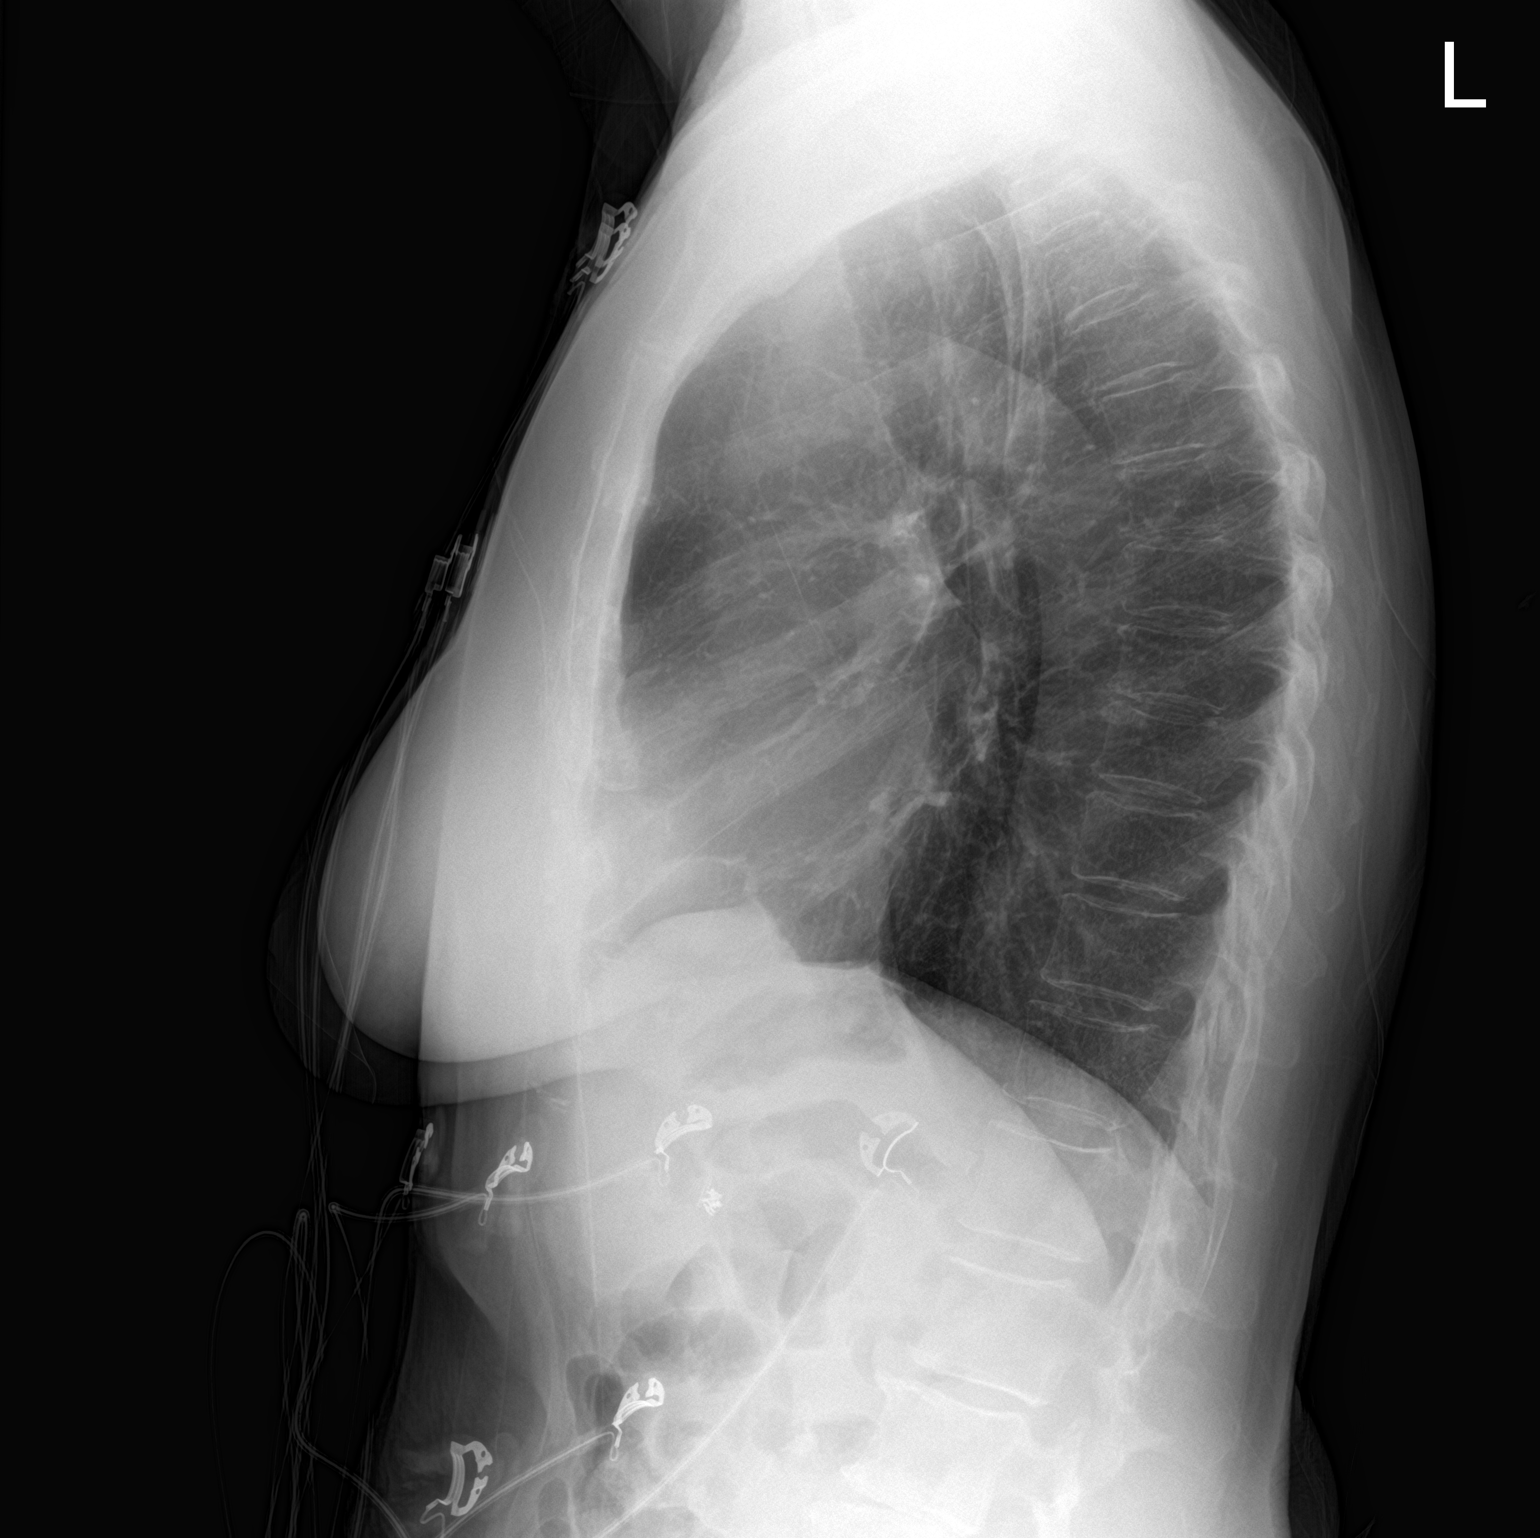

[2 of 2 positions shown; findings below may reference images not displayed]

FINDINGS: Heart size within normal limits. No appreciable airspace
consolidation or pulmonary edema. No evidence of pleural effusion or
pneumothorax. No acute bony abnormality identified. Surgical clips
within the right upper quadrant of the abdomen.
IMPRESSION: No evidence of active cardiopulmonary disease.

## 2020-10-27 MED ORDER — IOHEXOL 350 MG/ML SOLN
100.0000 mL | Freq: Once | INTRAVENOUS | Status: AC | PRN
  Administered 2020-10-27: 100 mL via INTRAVENOUS

## 2020-10-27 MED ORDER — SODIUM CHLORIDE 0.9 % IV BOLUS
1000.0000 mL | Freq: Once | INTRAVENOUS | Status: AC
  Administered 2020-10-27: 1000 mL via INTRAVENOUS

## 2020-10-27 NOTE — ED Triage Notes (Signed)
Chest tightness into her left arm with sob x 2 days. She called 911 and EMS did an EKG this am. She refused transport.

## 2020-10-27 NOTE — ED Notes (Signed)
Lab notified of D-dimer order.  Adding to save tube.

## 2020-10-27 NOTE — Discharge Instructions (Signed)
You may alternate taking Tylenol and Ibuprofen as needed for pain control. You may take 400-600 mg of ibuprofen every 6 hours and [phone] mg of Tylenol every 6 hours. Do not exceed 4000 mg of Tylenol daily as this can lead to liver damage. Also, make sure to take Ibuprofen with meals as it can cause an upset stomach. Do not take other NSAIDs while taking Ibuprofen such as (Aleve, Naprosyn, Aspirin, Celebrex, etc) and do not take more than the prescribed dose as this can lead to ulcers and bleeding in your GI tract. You may use warm and cold compresses to help with your symptoms.   Please follow up with your primary doctor and/or the cardiologist within the next 7-10 days for re-evaluation and further treatment of your symptoms.   Please return to the ER sooner if you have any new or worsening symptoms.

## 2020-10-27 NOTE — ED Notes (Signed)
Pt discharged to home. Discharge instructions have been discussed with patient and/or family members. Pt verbally acknowledges understanding d/c instructions, and endorses comprehension to checkout at registration before leaving.  °

## 2020-10-27 NOTE — ED Provider Notes (Signed)
[facility] EMERGENCY DEPARTMENT Provider Note   CSN: [id number] Arrival date & time: 10/27/20  1322     History Chief Complaint  Patient presents with  . Chest Pain    Andrea Carroll is a 74 y.o. female.  HPI   Pt is a 74 y/o female with a h/o cataracts, DDD, GERD, HLD, who presents to the ED today for eval of chest pressure that started a few days ago. The pain has been intermittent. States pain is nonexertional and that she was able to rake leaves the other day without pain. 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 Reports some associated nausea and night sweats. States for the last week she has been some dizziness/lightheadedness upon standing which occurs in the morning.  Has periodic shortness of breath but none recently.   States she felt shaky all over and her BP was high today.   Past Medical History:  Diagnosis Date  . Cataract    bialteral  . DDD (degenerative disc disease), cervical    also lower back  . GERD (gastroesophageal reflux disease)   . Hyperlipidemia    diet controlled, no medications    Patient Active Problem List   Diagnosis Date Noted  . Cholecystitis with cholelithiasis 01/05/2015    Past Surgical History:  Procedure Laterality Date  . ABDOMINAL HYSTERECTOMY    . biospy surgery Right    breast  . CHOLECYSTECTOMY N/A 01/06/2015   Procedure: LAPAROSCOPIC CHOLECYSTECTOMY WITH INTRAOPERATIVE CHOLANGIOGRAM;  Surgeon: Erroll Luna, MD;  Location: [facility];  Service: General;  Laterality: N/A;  . COLONOSCOPY  11/2005   [facility]  . SHOULDER SURGERY Left      OB History   No obstetric history on file.     Family History  Problem Relation Age of Onset  . Colon cancer Neg Hx   . Colon polyps Neg Hx   . Esophageal cancer Neg Hx   . Rectal cancer Neg Hx   . Stomach cancer Neg Hx     Social History   Tobacco Use  . Smoking status: Former Smoker    Packs/day: 0.50    Types:  Cigarettes    Quit date: 12/30/1967    Years since quitting: 52.8  . Smokeless tobacco: Never Used  Substance Use Topics  . Alcohol use: No    Alcohol/week: 0.0 standard drinks  . Drug use: No    Home Medications Prior to Admission medications   Medication Sig Start Date End Date Taking? Authorizing Provider  Acetaminophen (TYLENOL) 325 MG CAPS Tylenol 325 mg capsule  Take by oral route.   Yes [provider]  aspirin 81 MG tablet Take 81 mg by mouth daily.   Yes [provider]  calcium carbonate (OS-CAL) 600 MG TABS tablet Take 600 mg by mouth 2 (two) times daily with a meal.   Yes [provider]  Glucosamine-Chondroit-Vit C-Mn (GLUCOSAMINE 1500 COMPLEX) CAPS Take 1 capsule by mouth daily.    Yes [provider]  loratadine (CLARITIN) 10 MG tablet Take 10 mg by mouth daily.   Yes [provider]  Multiple Vitamin (MULTI VITAMIN DAILY PO) Take 1 tablet by mouth daily.    Yes [provider]  OMEPRAZOLE PO Take by mouth.   Yes [provider]  traMADol (ULTRAM) 50 MG tablet Take 1-2 tablets (50-100 mg total) by mouth every 6 (six) hours as needed for moderate pain. 01/07/15  Yes  Saverio Danker, PA-C  Turmeric (QC TUMERIC COMPLEX PO) Take by mouth.   Yes [provider]  Vitamin D, Ergocalciferol, (DRISDOL) 50000 UNITS CAPS capsule Take 50,000 Units by mouth every 7 (seven) days. friday   Yes [provider]  DETROL LA 4 MG 24 hr capsule Take 4 mg by mouth daily.  04/23/14   [provider]  lansoprazole (PREVACID) 30 MG capsule Take 30 mg by mouth daily.  05/31/14   [provider]  nitrofurantoin, macrocrystal-monohydrate, (MACROBID) 100 MG capsule Take 100 mg by mouth 2 (two) times daily. 04/25/18   [provider]  Omega-3 Fatty Acids (FISH OIL) 1000 MG CAPS Take 1 capsule by mouth daily.     [provider]    Allergies    Sulfa antibiotics  Review of Systems   Review  of Systems  Constitutional: Negative for fever.  HENT: Negative for ear pain and sore throat.   Eyes: Negative for pain and visual disturbance.  Respiratory: Negative for cough and shortness of breath.   Cardiovascular: Positive for chest pain (pressure).  Gastrointestinal: Positive for nausea. Negative for abdominal pain, constipation, diarrhea and vomiting.  Genitourinary: Negative for dysuria and hematuria.  Musculoskeletal: Negative for back pain.  Skin: Negative for rash.  Neurological: Positive for dizziness and light-headedness.  All other systems reviewed and are negative.   Physical Exam Updated Vital Signs BP 134/73   Pulse 65   Temp 98.3 F (36.8 C) (Oral)   Resp 14   Ht 5\' 2"  (1.575 m)   Wt 58.7 kg   SpO2 100%   BMI 23.65 kg/m   Physical Exam Vitals and nursing note reviewed.  Constitutional:      General: She is not in acute distress.    Appearance: She is well-developed.  HENT:     Head: Normocephalic and atraumatic.  Eyes:     Conjunctiva/sclera: Conjunctivae normal.  Cardiovascular:     Rate and Rhythm: Normal rate and regular rhythm.     Heart sounds: Normal heart sounds. No murmur heard.   Pulmonary:     Effort: Pulmonary effort is normal. No respiratory distress.     Breath sounds: Normal breath sounds. No decreased breath sounds, wheezing, rhonchi or rales.  Abdominal:     Palpations: Abdomen is soft.     Tenderness: There is no abdominal tenderness.  Musculoskeletal:     Cervical back: Neck supple.     Right lower leg: No tenderness. No edema.     Left lower leg: No tenderness. No edema.  Skin:    General: Skin is warm and dry.  Neurological:     Mental Status: She is alert.     ED Results / Procedures / Treatments   Labs (all labs ordered are listed, but only abnormal results are displayed) Labs Reviewed  COMPREHENSIVE METABOLIC PANEL - Abnormal; Notable for the following components:      Result Value   Glucose, Bld 103 (*)    All  other components within normal limits  D-DIMER, QUANTITATIVE (NOT AT [facility]) - Abnormal; Notable for the following components:   D-Dimer, Quant 0.59 (*)    All other components within normal limits  CBC  TROPONIN I (HIGH SENSITIVITY)  TROPONIN I (HIGH SENSITIVITY)    EKG EKG Interpretation  Date/Time:  Tuesday October 27 2020 13:25:10 EST Ventricular Rate:  76 PR Interval:  144 QRS Duration: 88 QT Interval:  396 QTC Calculation: 445 R Axis:   90 Text Interpretation: Normal  sinus rhythm Rightward axis Borderline ECG Confirmed by Lennice Sites [phone]) on 10/27/2020 2:56:54 PM   Radiology DG Chest 2 View  Result Date: 10/27/2020 CLINICAL DATA:  Chest tightness. Additional history provided: Patient reports chest tightness EXAM: CHEST - 2 VIEW COMPARISON:  Chest radiographs 01/05/2015. FINDINGS: Heart size within normal limits. No appreciable airspace consolidation or pulmonary edema. No evidence of pleural effusion or pneumothorax. No acute bony abnormality identified. Surgical clips within the right upper quadrant of the abdomen. IMPRESSION: No evidence of active cardiopulmonary disease. Electronically Signed   By: Kellie Simmering DO   On: 10/27/2020 13:58   CT Angio Chest PE W and/or Wo Contrast  Result Date: 10/27/2020 CLINICAL DATA:  Chest tightness radiating into left arm with shortness of breath, symptoms for 2 days EXAM: CT ANGIOGRAPHY CHEST WITH CONTRAST TECHNIQUE: Multidetector CT imaging of the chest was performed using the standard protocol during bolus administration of intravenous contrast. Multiplanar CT image reconstructions and MIPs were obtained to evaluate the vascular anatomy. CONTRAST:  179mL OMNIPAQUE IOHEXOL 350 MG/ML SOLN COMPARISON:  10/27/2020 FINDINGS: Cardiovascular: This is a technically adequate evaluation of the pulmonary vasculature. There are no filling defects or pulmonary emboli. The heart is unremarkable without pericardial effusion. Thoracic aorta is  normal, with no evidence of aneurysm or dissection. Mediastinum/Nodes: No enlarged mediastinal, hilar, or axillary lymph nodes. Thyroid gland, trachea, and esophagus demonstrate no significant findings. Lungs/Pleura: No acute airspace disease, effusion, or pneumothorax. Central airways are patent. Upper Abdomen: No acute abnormality. Musculoskeletal: No acute or destructive bony lesions. Reconstructed images demonstrate no additional findings. Review of the MIP images confirms the above findings. IMPRESSION: 1. No evidence of pulmonary embolus. 2. No acute intrathoracic process. Electronically Signed   By: Randa Ngo M.D.   On: 10/27/2020 17:13    Procedures Procedures (including critical care time)  Medications Ordered in ED Medications  sodium chloride 0.9 % bolus 1,000 mL (0 mLs Intravenous Stopped 10/27/20 1600)  iohexol (OMNIPAQUE) 350 MG/ML injection 100 mL (100 mLs Intravenous Contrast Given 10/27/20 1646)    ED Course  I have reviewed the triage vital signs and the nursing notes.  Pertinent labs & imaging results that were available during my care of the patient were reviewed by me and considered in my medical decision making (see chart for details).    MDM Rules/Calculators/A&P                          74 y/o F presenting for eval of chest pain.  Reviewed/interpreted labs CBC unremarkable CMP unremarkable Ddimer marginally elevated Trop neg, delta trop neg  EKG - Normal sinus rhythm Rightward axis Borderline ECG  CXR reviewed/interpreted - No evidence of active cardiopulmonary disease.  CTA chest does not show any evidence of PE.  There is no significant other findings noted on CT scan.   Patient's work-up today reassuring.  Heart score of 3, doubt ACS.  No obvious PE.  Doubt dissection or other emergent pathology if symptoms that require further work-up or admission to the hospital.  Could be related to MSK cause given recent raking of leaves.  Advised  over-the-counter medications for symptoms.  Will give referral to cardiologist since she has not had a work-up by them before.  Have advised close monitoring of symptoms and strict return precautions.  She voiced understanding the plan and reasons to return.  all questions answered.  Patient stable for discharge.  Pt seen in conjunction with dr Ronnald Nian  who personally evaluated pt and is in agreement with plan.   Final Clinical Impression(s) / ED Diagnoses Final diagnoses:  Atypical chest pain    Rx / DC Orders ED Discharge Orders         Ordered    Ambulatory referral to Cardiology        10/27/20 [place], [place], PA-C 10/27/20 [place], [place], DO 10/27/20 1742

## 2020-10-27 NOTE — ED Notes (Signed)
Assisted patient to the bathroom, gait steady

## 2020-10-27 NOTE — ED Provider Notes (Signed)
Medical screening examination/treatment/procedure(s) were conducted as a shared visit with non-physician practitioner(s) and myself.  I personally evaluated the patient during the encounter. Briefly, the patient is a 74 y.o. female with history of high cholesterol but does not take any cholesterol medication who presents the ED with chest pain.  Mostly left-sided.  Possibly pain started after raking leaves.  Does have some pinpoint tenderness over the left chest wall.  Has had pain on and off for the last 3 days.  Currently pain-free now.  EKG shows sinus rhythm.  No ischemic changes.  Troponin negative x2.  Heart score is 3 and lower suspicion for ACS as I suspect likely MSK type pain.  She did get a CT scan that showed no PE or pneumonia.  No obvious coronary calcifications either.  No significant anemia, electrolyte abnormality, kidney injury.  Given her low heart score and atypical story we will have her follow-up with primary care/cardiology outpatient.  She understands return precautions.  She feels comfortable with this plan and prefers this plan as well.  Recommend Tylenol Motrin and given return precautions and discharged in good condition.  This chart was dictated using voice recognition software.  Despite best efforts to proofread,  errors can occur which can change the documentation meaning.     EKG Interpretation  Date/Time:  Tuesday October 27 2020 13:25:10 EST Ventricular Rate:  76 PR Interval:  144 QRS Duration: 88 QT Interval:  396 QTC Calculation: 445 R Axis:   90 Text Interpretation: Normal sinus rhythm Rightward axis Borderline ECG Confirmed by Lennice Sites [phone]) on 10/27/2020 2:56:54 PM           Lennice Sites, DO 10/27/20 1718

## 2020-11-08 NOTE — Progress Notes (Signed)
Cardiology Office Note:    Date:  11/11/2020   ID:  Andrea Carroll, DOB [birth year], MRN [id number]  PCP:  Prince Solian, MD  [facility] Cardiologist:  Freada Bergeron, MD  [facility] Electrophysiologist:  None   Referring MD: Rodney Booze, PA-C    History of Present Illness:    Andrea Carroll is a 74 y.o. female with a hx of HLD not on medication who was referred by Coral Ceo, PA-C for further evaluation of chest pain.  Patient was in [facility] ED on 11/30 for left sided chest pain that developed after raking leaves. Pain was partially reproducible with palpation of her chest. ECG in ED with NSR no ischemic changes. Trop negative x2. CT chest without PE or significant coronary calcifications. Given low suspicion for ACS she was discharged home with plans for Cardiology follow-up.  The patient states that her husband passed away about 7 months ago from Parkinson's disease and she has been dealing with a lot of stress and grief since that time. Over the past month or so she has been having issues with shortness of breath. At the end of November, she had a particularly bad episode and called EMS who evaluated her. She had a reassuring examination and she was told to touch base with her PCP. After discussing her symptoms with her primary, she was told to go to the ED for further management as detailed above. While she was mainly concerned about her breathing, she also was having some chest discomfort at that time. This developed after raking leaves and she thought it may be MSK in nature. She continues to have intermittent episodes of Sob and chest discomfort. Sometimes associated with exertion and sometimes not. Symptoms are fleeting and last only a few minutes. Improve with deep breathing. Has some lightheadedness when she wakes up in the morning or changing positions, but no syncope. No LE edema or orthopnea. No nausea, vomiting, bleeding issues. She is otherwise healthy and  does not take any medications regularly except ASA 59m daily.  Patient is active and states she is able to walk a mile without issues and climb a flight of stairs without issues. She works in the yard regularly.   Family history: No known heart disease.   Past Medical History:  Diagnosis Date  . Cataract    bialteral  . DDD (degenerative disc disease), cervical    also lower back  . GERD (gastroesophageal reflux disease)   . Hyperlipidemia    diet controlled, no medications    Past Surgical History:  Procedure Laterality Date  . ABDOMINAL HYSTERECTOMY    . biospy surgery Right    breast  . CHOLECYSTECTOMY N/A 01/06/2015   Procedure: LAPAROSCOPIC CHOLECYSTECTOMY WITH INTRAOPERATIVE CHOLANGIOGRAM;  Surgeon: TErroll Luna MD;  [place] [facility]  Service: General;  Laterality: N/A;  . COLONOSCOPY  11/2005   [facility]  . SHOULDER SURGERY Left     Current Medications: Current Meds  Medication Sig  . Acetaminophen 325 MG CAPS Take 325 mg by mouth as needed (for pain).  .Marland Kitchenaspirin 81 MG tablet Take 81 mg by mouth daily.  . calcium carbonate (OS-CAL) 600 MG TABS tablet Take 600 mg by mouth daily.  . Glucosamine-Chondroit-Vit C-Mn (GLUCOSAMINE 1500 COMPLEX) CAPS Take 1 capsule by mouth daily.   . lansoprazole (PREVACID) 30 MG capsule Take 30 mg by mouth daily.   . Multiple Vitamin (MULTI VITAMIN DAILY PO) Take 1 tablet by mouth daily.   .Marland KitchenOMEPRAZOLE  PO Take 30 mg by mouth daily.  . traMADol (ULTRAM) 50 MG tablet Take 1-2 tablets (50-100 mg total) by mouth every 6 (six) hours as needed for moderate pain.  . Turmeric (QC TUMERIC COMPLEX PO) Take 1 tablet by mouth daily.  . Vitamin D, Ergocalciferol, (DRISDOL) 50000 UNITS CAPS capsule Take 50,000 Units by mouth every other day. friday   Current Facility-Administered Medications for the 11/11/20 encounter (Office Visit) with Freada Bergeron, MD  Medication  . lidocaine-prilocaine (EMLA) cream     Allergies:   Sulfa antibiotics    Social History   Socioeconomic History  . Marital status: Widowed    Spouse name: Not on file  . Number of children: Not on file  . Years of education: Not on file  . Highest education level: Not on file  Occupational History  . Not on file  Tobacco Use  . Smoking status: Former Smoker    Packs/day: 0.50    Types: Cigarettes    Quit date: 12/30/1967    Years since quitting: 52.9  . Smokeless tobacco: Never Used  Substance and Sexual Activity  . Alcohol use: No    Alcohol/week: 0.0 standard drinks  . Drug use: No  . Sexual activity: Not on file  Other Topics Concern  . Not on file  Social History Narrative  . Not on file   Social Determinants of Health   Financial Resource Strain: Not on file  Food Insecurity: Not on file  Transportation Needs: Not on file  Physical Activity: Not on file  Stress: Not on file  Social Connections: Not on file     Family History: The patient's family history is negative for Colon cancer, Colon polyps, Esophageal cancer, Rectal cancer, and Stomach cancer.  ROS:   Please see the history of present illness.    Review of Systems  Constitutional: Negative for chills and fever.  HENT: Negative for nosebleeds.   Eyes: Negative for blurred vision.  Respiratory: Positive for shortness of breath.   Cardiovascular: Positive for chest pain. Negative for palpitations, orthopnea, claudication, leg swelling and PND.  Gastrointestinal: Negative for melena, nausea and vomiting.  Genitourinary: Negative for hematuria.  Musculoskeletal: Positive for myalgias.  Neurological: Positive for dizziness. Negative for loss of consciousness.  Endo/Heme/Allergies: Negative for polydipsia.  Psychiatric/Behavioral: Negative for substance abuse.    EKGs/Labs/Other Studies Reviewed:    The following studies were reviewed today: CTA PE protocol 10/27/20: FINDINGS: Cardiovascular: This is a technically adequate evaluation of the pulmonary vasculature. There  are no filling defects or pulmonary emboli.  The heart is unremarkable without pericardial effusion. Thoracic aorta is normal, with no evidence of aneurysm or dissection.  Mediastinum/Nodes: No enlarged mediastinal, hilar, or axillary lymph nodes. Thyroid gland, trachea, and esophagus demonstrate no significant findings.  Lungs/Pleura: No acute airspace disease, effusion, or pneumothorax. Central airways are patent.  Upper Abdomen: No acute abnormality.  Musculoskeletal: No acute or destructive bony lesions. Reconstructed images demonstrate no additional findings.  Review of the MIP images confirms the above findings.  IMPRESSION: 1. No evidence of pulmonary embolus. 2. No acute intrathoracic process.  EKG:  10/28/20-NSR; borderline rightward axis. No ischemia or block.  Recent Labs: 10/27/2020: ALT 15; BUN 12; Creatinine, Ser 0.53; Hemoglobin 13.9; Platelets 317; Potassium 3.9; Sodium 140  Recent Lipid Panel No results found for: CHOL, TRIG, HDL, CHOLHDL, VLDL, LDLCALC, LDLDIRECT    Physical Exam:    VS:  BP 130/62   Pulse 70   Ht '5\' 2"'  (1.575  m)   Wt 132 lb (59.9 kg)   SpO2 98%   BMI 24.14 kg/m     Wt Readings from Last 3 Encounters:  11/11/20 132 lb (59.9 kg)  10/27/20 129 lb 4.8 oz (58.7 kg)  01/29/16 131 lb (59.4 kg)     GEN:  Well nourished, well developed in no acute distress HEENT: Normal NECK: No JVD; No carotid bruits CARDIAC: RRR, no murmurs, rubs, gallops RESPIRATORY:  Clear to auscultation without rales, wheezing or rhonchi  ABDOMEN: Soft, non-tender, non-distended MUSCULOSKELETAL:  No edema; No deformity  SKIN: Warm and dry NEUROLOGIC:  Alert and oriented x 3 PSYCHIATRIC:  Normal affect   ASSESSMENT:    1. Pre-procedure lab exam   2. Precordial pain   3. Mixed hyperlipidemia    PLAN:    In order of problems listed above:  #Shortness of breath: #Chest Discomfort: Patient with one month history of intermittent shortness of  breath and chest discomfort. Symptoms are occasionally exertional but can occur at rest. Was seen in the ED a couple of weeks ago where trops negative. ECG without ischemic changes. CT PE protocol negative although difficult to see coronary anatomy on study. Does not sound like typical chest pain, however, has risk factors with age and HLD. -Will check coronary CTA  #HLD: Monitored by PCP. Will follow-up CTA and assess if merits statin therapy.  Medication Adjustments/Labs and Tests Ordered: Current medicines are reviewed at length with the patient today.  Concerns regarding medicines are outlined above.  Orders Placed This Encounter  Procedures  . CT CORONARY MORPH W/CTA COR W/SCORE W/CA W/CM &/OR WO/CM  . Basic metabolic panel   Meds ordered this encounter  Medications  . metoprolol tartrate (LOPRESSOR) 50 MG tablet    Sig: Take one tablet by mouth 2 hours before ct    Dispense:  1 tablet    Refill:  0    Patient Instructions  Medication Instructions:  No changes *If you need a refill on your cardiac medications before your next appointment, please call your pharmacy*   Lab Work: None today If you have labs (blood work) drawn today and your tests are completely normal, you will receive your results only by: Marland Kitchen MyChart Message (if you have MyChart) OR . A paper copy in the mail If you have any lab test that is abnormal or we need to change your treatment, we will call you to review the results.   Testing/Procedures: Cardiac CTA - see instruction letter   Follow-Up: Follow up with your physician will depend on test results.   Other Instructions  Your cardiac CT will be scheduled at one of the below locations:   [facility] [address] [place], [place] [address] [phone]  [facility] [address] [place], [place] [address] [phone]  If scheduled at [facility], please arrive at  the [facility] main entrance of [facility] 30 minutes prior to test start time. Proceed to the [facility] Radiology Department (first floor) to check-in and test prep.  If scheduled at [facility], please arrive 15 mins early for check-in and test prep.  Please follow these instructions carefully (unless otherwise directed):   On the Night Before the Test: . Be sure to Drink plenty of water. . Do not consume any caffeinated/decaffeinated beverages or chocolate 12 hours prior to your test. Do not take any antihistamines 12 hours prior to your test.  On the Day of the Test: . Drink plenty of water. Do not drink any water within one hour of the test. . Do not eat any food 4 hours prior to the test. . You may take your regular medications prior to the test.  . Take metoprolol (Lopressor) two hours prior to test. . FEMALES- please wear underwire-free bra if available      After the Test: . Drink plenty of water. . After receiving IV contrast, you may experience a mild flushed feeling. This is normal. . On occasion, you may experience a mild rash up to 24 hours after the test. This is not dangerous. If this occurs, you can take Benadryl 25 mg and increase your fluid intake. . If you experience trouble breathing, this can be serious. If it is severe call 911 IMMEDIATELY. If it is mild, please call our office. . If you take any of these medications: Glipizide/Metformin, Avandament, Glucavance, please do not take 48 hours after completing test unless otherwise instructed.   Once we have confirmed authorization from your insurance company, we will call you to set up a date and time for your test. Based on how quickly your insurance processes prior authorizations requests, please allow up to 4 weeks to be contacted for scheduling your Cardiac CT appointment. Be advised that routine Cardiac CT appointments could be scheduled as many as 8 weeks after your provider  has ordered it.  For non-scheduling related questions, please contact the cardiac imaging nurse navigator should you have any questions/concerns: Marchia Bond, Cardiac Imaging Nurse Navigator Burley Saver, Interim Cardiac Imaging Nurse [facility] Direct Office Dial: [phone]   For scheduling needs, including cancellations and rescheduling, please call [place], [phone].      Signed, Freada Bergeron, MD  11/11/2020 11:11 AM    [place]

## 2020-11-11 ENCOUNTER — Other Ambulatory Visit: Payer: Self-pay

## 2020-11-11 ENCOUNTER — Encounter: Payer: Self-pay | Admitting: Cardiology

## 2020-11-11 ENCOUNTER — Encounter: Payer: Self-pay | Admitting: *Deleted

## 2020-11-11 ENCOUNTER — Ambulatory Visit (INDEPENDENT_AMBULATORY_CARE_PROVIDER_SITE_OTHER): Payer: Medicare Other | Admitting: Cardiology

## 2020-11-11 VITALS — BP 130/62 | HR 70 | Ht 62.0 in | Wt 132.0 lb

## 2020-11-11 DIAGNOSIS — Z01812 Encounter for preprocedural laboratory examination: Secondary | ICD-10-CM | POA: Diagnosis not present

## 2020-11-11 DIAGNOSIS — R072 Precordial pain: Secondary | ICD-10-CM

## 2020-11-11 DIAGNOSIS — E782 Mixed hyperlipidemia: Secondary | ICD-10-CM

## 2020-11-11 MED ORDER — METOPROLOL TARTRATE 50 MG PO TABS: ORAL_TABLET | ORAL | 0 refills | Status: DC

## 2020-11-11 NOTE — Patient Instructions (Addendum)
Medication Instructions:  No changes *If you need a refill on your cardiac medications before your next appointment, please call your pharmacy*   Lab Work: None today If you have labs (blood work) drawn today and your tests are completely normal, you will receive your results only by: Marland Kitchen MyChart Message (if you have MyChart) OR . A paper copy in the mail If you have any lab test that is abnormal or we need to change your treatment, we will call you to review the results.   Testing/Procedures: Cardiac CTA - see instruction letter   Follow-Up: Follow up with your physician will depend on test results.   Other Instructions  Your cardiac CT will be scheduled at one of the below locations:   [facility] [address] [place], [place] [address] [phone]  [place] [address] [place], [place] [address] [phone]  If scheduled at [facility], please arrive at the [facility] main entrance of [facility] 30 minutes prior to test start time. Proceed to the [facility] Radiology Department (first floor) to check-in and test prep.  If scheduled at [facility], please arrive 15 mins early for check-in and test prep.  Please follow these instructions carefully (unless otherwise directed):   On the Night Before the Test: . Be sure to Drink plenty of water. . Do not consume any caffeinated/decaffeinated beverages or chocolate 12 hours prior to your test. Do not take any antihistamines 12 hours prior to your test.  On the Day of the Test: . Drink plenty of water. Do not drink any water within one hour of the test. . Do not eat any food 4 hours prior to the test. . You may take your regular medications prior to the test.  . Take metoprolol (Lopressor) two hours prior to test. . FEMALES- please wear underwire-free bra if available      After the Test: . Drink  plenty of water. . After receiving IV contrast, you may experience a mild flushed feeling. This is normal. . On occasion, you may experience a mild rash up to 24 hours after the test. This is not dangerous. If this occurs, you can take Benadryl 25 mg and increase your fluid intake. . If you experience trouble breathing, this can be serious. If it is severe call 911 IMMEDIATELY. If it is mild, please call our office. . If you take any of these medications: Glipizide/Metformin, Avandament, Glucavance, please do not take 48 hours after completing test unless otherwise instructed.   Once we have confirmed authorization from your insurance company, we will call you to set up a date and time for your test. Based on how quickly your insurance processes prior authorizations requests, please allow up to 4 weeks to be contacted for scheduling your Cardiac CT appointment. Be advised that routine Cardiac CT appointments could be scheduled as many as 8 weeks after your provider has ordered it.  For non-scheduling related questions, please contact the cardiac imaging nurse navigator should you have any questions/concerns: Marchia Bond, Cardiac Imaging Nurse Navigator Burley Saver, Interim Cardiac Imaging Nurse [facility] Direct Office Dial: [phone]   For scheduling needs, including cancellations and rescheduling, please call [place], [phone].

## 2020-12-08 ENCOUNTER — Other Ambulatory Visit: Payer: Medicare Other | Admitting: *Deleted

## 2020-12-08 ENCOUNTER — Other Ambulatory Visit: Payer: Self-pay

## 2020-12-08 DIAGNOSIS — Z01812 Encounter for preprocedural laboratory examination: Secondary | ICD-10-CM

## 2020-12-08 DIAGNOSIS — E782 Mixed hyperlipidemia: Secondary | ICD-10-CM

## 2020-12-08 DIAGNOSIS — R072 Precordial pain: Secondary | ICD-10-CM

## 2020-12-09 LAB — BASIC METABOLIC PANEL
BUN/Creatinine Ratio: 20 (ref 12–28)
BUN: 13 mg/dL (ref 8–27)
CO2: 27 mmol/L (ref 20–29)
Calcium: 9 mg/dL (ref 8.7–10.3)
Chloride: 101 mmol/L (ref 96–106)
Creatinine, Ser: 0.66 mg/dL (ref 0.57–1.00)
GFR calc Af Amer: 99 mL/min/{1.73_m2} (ref >59)
GFR calc non Af Amer: 86 mL/min/{1.73_m2} (ref >59)
Glucose: 85 mg/dL (ref 65–99)
Potassium: 4.5 mmol/L (ref 3.5–5.2)
Sodium: 140 mmol/L (ref 134–144)

## 2020-12-10 ENCOUNTER — Telehealth (HOSPITAL_COMMUNITY): Payer: Self-pay | Admitting: Emergency Medicine

## 2020-12-10 NOTE — Telephone Encounter (Signed)
Reaching out to patient to offer assistance regarding upcoming cardiac imaging study; pt verbalizes understanding of appt date/time, parking situation and where to check in, pre-test NPO status and medications ordered, and verified current allergies; name and call back number provided for further questions should they arise Marchia Bond RN Navigator Cardiac Imaging [facility] [phone] office [phone] cell  50mg  metop tart 2h PTA

## 2020-12-11 ENCOUNTER — Encounter: Payer: Medicare Other | Admitting: *Deleted

## 2020-12-11 ENCOUNTER — Ambulatory Visit (HOSPITAL_COMMUNITY)
Admission: RE | Admit: 2020-12-11 | Discharge: 2020-12-11 | Disposition: A | Discharge status: Home / Self Care | Payer: Medicare Other | Source: Ambulatory Visit | Attending: Cardiology | Admitting: Cardiology

## 2020-12-11 ENCOUNTER — Other Ambulatory Visit: Payer: Self-pay

## 2020-12-11 ENCOUNTER — Encounter (HOSPITAL_COMMUNITY): Payer: Self-pay

## 2020-12-11 DIAGNOSIS — Z01812 Encounter for preprocedural laboratory examination: Secondary | ICD-10-CM

## 2020-12-11 DIAGNOSIS — E782 Mixed hyperlipidemia: Secondary | ICD-10-CM | POA: Insufficient documentation

## 2020-12-11 DIAGNOSIS — Z006 Encounter for examination for normal comparison and control in clinical research program: Secondary | ICD-10-CM

## 2020-12-11 DIAGNOSIS — R072 Precordial pain: Secondary | ICD-10-CM | POA: Insufficient documentation

## 2020-12-11 MED ORDER — NITROGLYCERIN 0.4 MG SL SUBL
SUBLINGUAL_TABLET | SUBLINGUAL | Status: AC
  Filled 2020-12-11: qty 2

## 2020-12-11 MED ORDER — NITROGLYCERIN 0.4 MG SL SUBL
0.8000 mg | SUBLINGUAL_TABLET | Freq: Once | SUBLINGUAL | Status: AC
  Administered 2020-12-11: 0.8 mg via SUBLINGUAL

## 2020-12-11 MED ORDER — IOHEXOL 350 MG/ML SOLN
80.0000 mL | Freq: Once | INTRAVENOUS | Status: AC | PRN
  Administered 2020-12-11: 80 mL via INTRAVENOUS

## 2020-12-11 NOTE — Progress Notes (Signed)
Patient tolerated CT well. Drank water and ate cookies after. Vital signs stable encourage to drink water throughout day.Reasons explained and verbalized understanding. Ambulated steady gait.  

## 2020-12-11 NOTE — Research (Signed)
Subject Name: Andrea Carroll  Subject met inclusion and exclusion criteria.  The informed consent form, study requirements and expectations were reviewed with the subject and questions and concerns were addressed prior to the signing of the consent form.  The subject verbalized understanding of the trial requirements.  The subject agreed to participate in the IDENTIFY trial and signed the informed consent at [place] on 12/11/20  The informed consent was obtained prior to performance of any protocol-specific procedures for the subject.  A copy of the signed informed consent was given to the subject and a copy was placed in the subject's medical record.   Timoteo Gaul

## 2020-12-14 ENCOUNTER — Telehealth: Payer: Self-pay | Admitting: Cardiology

## 2020-12-14 NOTE — Telephone Encounter (Signed)
See CT results for  notes

## 2020-12-14 NOTE — Telephone Encounter (Signed)
    Pt is returning call to get CT result. She said to call her on her Home number

## 2021-01-06 DIAGNOSIS — N390 Urinary tract infection, site not specified: Secondary | ICD-10-CM | POA: Diagnosis not present

## 2021-02-11 ENCOUNTER — Ambulatory Visit (INDEPENDENT_AMBULATORY_CARE_PROVIDER_SITE_OTHER): Payer: Medicare Other | Admitting: Gastroenterology

## 2021-02-11 ENCOUNTER — Other Ambulatory Visit: Payer: Self-pay

## 2021-02-11 ENCOUNTER — Encounter: Payer: Self-pay | Admitting: Gastroenterology

## 2021-02-11 VITALS — BP 110/72 | HR 71 | Ht 62.0 in | Wt 131.0 lb

## 2021-02-11 DIAGNOSIS — Z79899 Other long term (current) drug therapy: Secondary | ICD-10-CM

## 2021-02-11 DIAGNOSIS — Z86010 Personal history of colonic polyps: Secondary | ICD-10-CM | POA: Diagnosis not present

## 2021-02-11 DIAGNOSIS — R1310 Dysphagia, unspecified: Secondary | ICD-10-CM

## 2021-02-11 DIAGNOSIS — K219 Gastro-esophageal reflux disease without esophagitis: Secondary | ICD-10-CM

## 2021-02-11 MED ORDER — OMEPRAZOLE 20 MG PO CPDR: 20.0000 mg | DELAYED_RELEASE_CAPSULE | Freq: Every day | ORAL | 3 refills | Status: DC

## 2021-02-11 MED ORDER — PLENVU 140 G PO SOLR: 1.0000 | Freq: Once | ORAL | 0 refills | Status: AC

## 2021-02-11 NOTE — Progress Notes (Signed)
HPI :  75 year old female with a history of GERD, dysphagia, colon polyps, here to reestablish care for surveillance colonoscopy and discuss symptoms of dysphagia, referred by Dr. Prince Solian.  The last time I saw her was about 5 years ago for her last surveillance colonoscopy.  She had 2 x 7 mm sessile serrated polyps removed in the ascending, and transverse colon.  Diverticulosis noted as well.  Repeat colonoscopy was recommended for 5 years.  She denies any problems with her bowels that bother her.  No constipation or diarrhea.  She has regular bowel habits without blood.  She has some occasional perianal pruritus but otherwise no other lower tract symptoms.  She denies any family history of colon cancer.  She states she has had difficulty with dysphagia over the years.  She states she had her esophagus dilated twice about 15 to 20 years ago for symptoms of dysphagia -we do not have any record of that on file at our facility.  In general she has been doing okay but has had progressive dysphagia with pills more recently.  This seems to stick in her upper chest and has to drink water to push them down.  This will occasionally happen with solid foods but not really liquids.  She states she has been on Prevacid 30 mg a day for perhaps upwards of 20 years.  She states that she has had some episodes of chest tightness in the past she has had visits to the emergency room in the past for chest discomfort and has had negative cardiac work-up.  She has been on Prevacid and this seems to prevent episodes from happening.  She denies any other pyrosis or regurgitation at baseline.  No nausea or vomiting.  She recently had a cardiac CT scan done her calcium score was 0.  She denies any chronic kidney disease.  She does have a history of osteopenia but no history of bone fractures.  She states her insurance company does not cover more than 210 pills of lansoprazole per year, unclear why.  No weight loss or alarm  symptoms otherwise.  Prior work-up: Colonoscopy 01/29/2016 - There was a 40mm flat polyp with a mucous cap noted in the ascending colon and removed with cold snare. Another 65mm flat polyp was noted in the transverse colon and removed with cold snare. Mild diverticulosis was noted in the sigmoid colon. The remainder of the colon was normal. Retroflexed views revealed no abnormalities. The time to cecum = 2.4 Withdrawal time = 15.1 The scope was withdrawn and the procedure completed. COMPLICATIONS: There were no immediate complications.  Surgical [P], descending and transverse, polyp (2) - SESSILE SERRATED POLYP. - NO ADENOMATOUS CHANGE OR MALIGNANCY IDENTIFIED     Past Medical History:  Diagnosis Date  . Cataract    bialteral  . DDD (degenerative disc disease), cervical    also lower back  . GERD (gastroesophageal reflux disease)   . Hyperlipidemia    diet controlled, no medications     Past Surgical History:  Procedure Laterality Date  . ABDOMINAL HYSTERECTOMY    . biospy surgery Right    breast  . CHOLECYSTECTOMY N/A 01/06/2015   Procedure: LAPAROSCOPIC CHOLECYSTECTOMY WITH INTRAOPERATIVE CHOLANGIOGRAM;  Surgeon: Erroll Luna, MD;  [facility];  Service: General;  Laterality: N/A;  . COLONOSCOPY  11/2005   Andrea Carroll  . ESOPHAGOGASTRODUODENOSCOPY     Dr Lajoyce Corners  . SHOULDER SURGERY Left    Family History  Problem Relation Age of Onset  .  Alzheimer's disease Mother   . Other Father        brain tumor, died at age 38  . Colon cancer Neg Hx   . Colon polyps Neg Hx   . Esophageal cancer Neg Hx   . Rectal cancer Neg Hx   . Stomach cancer Neg Hx    Social History   Tobacco Use  . Smoking status: Former Smoker    Packs/day: 0.50    Types: Cigarettes    Quit date: 12/30/1967    Years since quitting: 53.1  . Smokeless tobacco: Never Used  Vaping Use  . Vaping Use: Never used  Substance Use Topics  . Alcohol use: No    Alcohol/week: 0.0 standard drinks  . Drug use: No    Current Outpatient Medications  Medication Sig Dispense Refill  . Acetaminophen 325 MG CAPS Take 325 mg by mouth as needed (for pain).    . calcium carbonate (OS-CAL) 600 MG TABS tablet Take 600 mg by mouth daily.    . Glucosamine-Chondroit-Vit C-Mn (GLUCOSAMINE 1500 COMPLEX) CAPS Take 1 capsule by mouth daily.     . lansoprazole (PREVACID) 30 MG capsule Take 30 mg by mouth daily.     . Multiple Vitamin (MULTI VITAMIN DAILY PO) Take 1 tablet by mouth daily.     . traMADol (ULTRAM) 50 MG tablet Take 1-2 tablets (50-100 mg total) by mouth every 6 (six) hours as needed for moderate pain. 30 tablet 0  . Turmeric (QC TUMERIC COMPLEX PO) Take 1 tablet by mouth daily.    . Vitamin D, Ergocalciferol, (DRISDOL) 50000 UNITS CAPS capsule Take 50,000 Units by mouth every other day. friday     Current Facility-Administered Medications  Medication Dose Route Frequency Provider Last Rate Last Admin  . lidocaine-prilocaine (EMLA) cream   Topical Once Bronson Ing, DPM       Allergies  Allergen Reactions  . Sulfa Antibiotics     rash     Review of Systems: All systems reviewed and negative except where noted in HPI.   Lab Results  Component Value Date   WBC 5.9 10/27/2020   HGB 13.9 10/27/2020   HCT 41.5 10/27/2020   MCV 90.4 10/27/2020   PLT 317 10/27/2020    Lab Results  Component Value Date   CREATININE 0.66 12/08/2020   BUN 13 12/08/2020   NA 140 12/08/2020   K 4.5 12/08/2020   CL 101 12/08/2020   CO2 27 12/08/2020    Lab Results  Component Value Date   ALT 15 10/27/2020   AST 19 10/27/2020   ALKPHOS 63 10/27/2020   BILITOT 0.5 10/27/2020     Physical Exam: BP 110/72   Pulse 71   Ht 5\' 2"  (1.575 m)   Wt 131 lb (59.4 kg)   BMI 23.96 kg/m  Constitutional: Pleasant,well-developed, female in no acute distress. HEENT: Normocephalic and atraumatic. Conjunctivae are normal. No scleral icterus. Neck supple.  Cardiovascular: Normal rate, regular rhythm.   Pulmonary/chest: Effort normal and breath sounds normal. No wheezing, rales or rhonchi. Abdominal: Soft, nondistended, nontender. There are no masses palpable.  Extremities: no edema Lymphadenopathy: No cervical adenopathy noted. Neurological: Alert and oriented to person place and time. Skin: Skin is warm and dry. No rashes noted. Psychiatric: Normal mood and affect. Behavior is normal.   ASSESSMENT AND PLAN: 75 year old female here to reestablish care for the following:  Dysphagia GERD Long-term use of proton pump inhibitor therapy History of colon polyps  As above, remote history  of EGD with dilation x2 for dysphagia, she states this controls her symptoms well however no report of that on file.  She has since developed dysphagia to pills and solids intermittently, has to drink water to push things down.  She also has a history of chest tightness and chest pressure that has been attributed to reflux, she has had prior ED visits and cardiac work-up that has been negative for cardiovascular causes otherwise.  On review of this with her she has been on moderate dose of lansoprazole for more than 20 years.  I discussed long-term risk benefits of chronic PPI use, specifically risk of renal dysfunction, increased risk for fracture, vitamin deficiencies, increased risk for gastric cancer, increased risk for C. difficile, etc.  She does have a history of osteopenia but no history of bone fracture.  Would want to use the lowest dose of PPI needed to control symptoms and perhaps try tapering off if she tolerates it.  Her insurance is not been covering lansoprazole for 1 year timeframe for unclear reasons.  I will switch her to omeprazole 20 mg a day due to cost and see if that will work just as well for her.  Given her dysphagia I offered her an EGD with dilation to treat this, will also screen for Barrett's esophagus in light of her longstanding PPI use and reported reflux.  Following discussion of risks  and benefits of this she want to proceed.  She also wants to proceed with colonoscopy for surveillance purposes, we will plan on doing these at the same time.  In the interim if omeprazole does not work as well as the lansoprazole she will contact us, hopefully over time we may be able to taper this off or use less frequency if she is stable.  She agreed with the plan  Plan: - EGD with possible dilation - we will switch from lansoprazole to omeprazole 20 mg a day - counseled on long-term risks of chronic PPI use, use lowest dose needed to control symptoms - surveillance colonoscopy  Scofield Cellar, MD [facility]  CC: Prince Solian, MD

## 2021-02-11 NOTE — Patient Instructions (Addendum)
If you are age 77 or older, your body mass index should be between 23-30. Your Body mass index is 23.96 kg/m. If this is out of the aforementioned range listed, please consider follow up with your Primary Care Provider.  If you are age 1 or younger, your body mass index should be between 19-25. Your Body mass index is 23.96 kg/m. If this is out of the aformentioned range listed, please consider follow up with your Primary Care Provider.   You have been scheduled for an endoscopy and colonoscopy. Please follow the written instructions given to you at your visit today. Please pick up your prep supplies at the pharmacy within the next 1-3 days. If you use inhalers (even only as needed), please bring them with you on the day of your procedure.  Discontinue lansoprazole.  We have sent the following medications to your pharmacy for you to pick up at your convenience: Omeprazole 20 mg: Take once daily  Thank you for entrusting me with your care and for choosing [facility], Dr. Idalou Cellar

## 2021-04-07 ENCOUNTER — Encounter: Payer: Self-pay | Admitting: Gastroenterology

## 2021-04-07 ENCOUNTER — Other Ambulatory Visit: Payer: Self-pay

## 2021-04-07 ENCOUNTER — Ambulatory Visit (AMBULATORY_SURGERY_CENTER): Payer: Medicare Other | Admitting: Gastroenterology

## 2021-04-07 ENCOUNTER — Telehealth: Payer: Self-pay

## 2021-04-07 VITALS — BP 137/59 | HR 58 | Temp 97.3°F | Resp 14 | Ht 62.0 in | Wt 131.0 lb

## 2021-04-07 DIAGNOSIS — Z86010 Personal history of colonic polyps: Secondary | ICD-10-CM | POA: Diagnosis not present

## 2021-04-07 DIAGNOSIS — K219 Gastro-esophageal reflux disease without esophagitis: Secondary | ICD-10-CM

## 2021-04-07 DIAGNOSIS — D120 Benign neoplasm of cecum: Secondary | ICD-10-CM

## 2021-04-07 DIAGNOSIS — K317 Polyp of stomach and duodenum: Secondary | ICD-10-CM

## 2021-04-07 DIAGNOSIS — D122 Benign neoplasm of ascending colon: Secondary | ICD-10-CM

## 2021-04-07 DIAGNOSIS — R1310 Dysphagia, unspecified: Secondary | ICD-10-CM | POA: Diagnosis not present

## 2021-04-07 DIAGNOSIS — Z006 Encounter for examination for normal comparison and control in clinical research program: Secondary | ICD-10-CM

## 2021-04-07 DIAGNOSIS — K635 Polyp of colon: Secondary | ICD-10-CM | POA: Diagnosis not present

## 2021-04-07 MED ORDER — SODIUM CHLORIDE 0.9 % IV SOLN: 500.0000 mL | Freq: Once | INTRAVENOUS | Status: DC

## 2021-04-07 NOTE — Telephone Encounter (Signed)
Called patient for 90 day Identify phone call no answer, I left a voicemail stating the intent of the phone call and our call back number to be reached in our department. 

## 2021-04-07 NOTE — Patient Instructions (Signed)
Please read handouts provided. Continue present medications. Await pathology results.     YOU HAD AN ENDOSCOPIC PROCEDURE TODAY AT THE [facility]:   Refer to the procedure report that was given to you for any specific questions about what was found during the examination.  If the procedure report does not answer your questions, please call your gastroenterologist to clarify.  If you requested that your care partner not be given the details of your procedure findings, then the procedure report has been included in a sealed envelope for you to review at your convenience later.  YOU SHOULD EXPECT: Some feelings of bloating in the abdomen. Passage of more gas than usual.  Walking can help get rid of the air that was put into your GI tract during the procedure and reduce the bloating. If you had a lower endoscopy (such as a colonoscopy or flexible sigmoidoscopy) you may notice spotting of blood in your stool or on the toilet paper. If you underwent a bowel prep for your procedure, you may not have a normal bowel movement for a few days.  Please Note:  You might notice some irritation and congestion in your nose or some drainage.  This is from the oxygen used during your procedure.  There is no need for concern and it should clear up in a day or so.  SYMPTOMS TO REPORT IMMEDIATELY:   Following lower endoscopy (colonoscopy or flexible sigmoidoscopy):  Excessive amounts of blood in the stool  Significant tenderness or worsening of abdominal pains  Swelling of the abdomen that is new, acute  Fever of 100F or higher   Following upper endoscopy (EGD)  Vomiting of blood or coffee ground material  New chest pain or pain under the shoulder blades  Painful or persistently difficult swallowing  New shortness of breath  Fever of 100F or higher  Black, tarry-looking stools  For urgent or emergent issues, a gastroenterologist can be reached at any hour by calling [phone]. Do not  use MyChart messaging for urgent concerns.    DIET:  We do recommend a small meal at first, but then you may proceed to your regular diet.  Drink plenty of fluids but you should avoid alcoholic beverages for 24 hours.  ACTIVITY:  You should plan to take it easy for the rest of today and you should NOT DRIVE or use heavy machinery until tomorrow (because of the sedation medicines used during the test).    FOLLOW UP: Our staff will call the number listed on your records 48-72 hours following your procedure to check on you and address any questions or concerns that you may have regarding the information given to you following your procedure. If we do not reach you, we will leave a message.  We will attempt to reach you two times.  During this call, we will ask if you have developed any symptoms of COVID 19. If you develop any symptoms (ie: fever, flu-like symptoms, shortness of breath, cough etc.) before then, please call [phone].  If you test positive for Covid 19 in the 2 weeks post procedure, please call and report this information to us.    If any biopsies were taken you will be contacted by phone or by letter within the next 1-3 weeks.  Please call us at [phone] if you have not heard about the biopsies in 3 weeks.    SIGNATURES/CONFIDENTIALITY: You and/or your care partner have signed paperwork which will be entered into your electronic medical   record.  These signatures attest to the fact that that the information above on your After Visit Summary has been reviewed and is understood.  Full responsibility of the confidentiality of this discharge information lies with you and/or your care-partner. 

## 2021-04-07 NOTE — Progress Notes (Signed)
To PACU, VSS. Report to [email or web address] 

## 2021-04-07 NOTE — Op Note (Signed)
[facility] Patient Name: Andrea Carroll Procedure Date: 04/07/2021 1:53 PM MRN: [id number] Endoscopist: Remo Lipps P. Havery Moros , MD Age: 75 Referring MD:  Date of Birth: 1946-06-23 Gender: Female Account #: [id number] Procedure:                Upper GI endoscopy Indications:              Dysphagia, history of gastro-esophageal reflux                            disease - recently switched from prevacid to                            omeprazole 20mg  / day with improvement in symptoms Medicines:                Monitored Anesthesia Care Procedure:                Pre-Anesthesia Assessment:                           - Prior to the procedure, a History and Physical                            was performed, and patient medications and                            allergies were reviewed. The patient's tolerance of                            previous anesthesia was also reviewed. The risks                            and benefits of the procedure and the sedation                            options and risks were discussed with the patient.                            All questions were answered, and informed consent                            was obtained. Prior Anticoagulants: The patient has                            taken no previous anticoagulant or antiplatelet                            agents. ASA Grade Assessment: II - A patient with                            mild systemic disease. After reviewing the risks                            and benefits, the patient was deemed in  satisfactory condition to undergo the procedure.                           After obtaining informed consent, the endoscope was                            passed under direct vision. Throughout the                            procedure, the patient's blood pressure, pulse, and                            oxygen saturations were monitored continuously. The                             Endoscope was introduced through the mouth, and                            advanced to the second part of duodenum. The upper                            GI endoscopy was accomplished without difficulty.                            The patient tolerated the procedure well. Scope In: Scope Out: Findings:                 Esophagogastric landmarks were identified: the                            Z-line was found at 37 cm, the gastroesophageal                            junction was found at 37 cm and the upper extent of                            the gastric folds was found at 37 cm from the                            incisors.                           The Z-line was irregular with a small island of                            salmon colored mucosa proximal to the GEJ. Biopsies                            were taken with a cold forceps for histology.                           The exam of the esophagus was otherwise normal.  A guidewire was placed and the scope was withdrawn.                            Empiric dilation was performed in the entire                            esophagus with a Savary dilator with mild                            resistance at 17 mm. Relook endoscopy showed no                            mucosal wrents.                           Multiple small sessile polyps were found in the                            gastric fundus and in the gastric body. Suspect                            benign fundic gland polyps. A representative polyp                            was removed with a cold biopsy forceps. Resection                            and retrieval were complete.                           The exam of the stomach was otherwise normal.                           A large diverticulum was found in the second                            portion of the duodenum.                           The exam of the duodenum was otherwise normal. Complications:             No immediate complications. Estimated blood loss:                            Minimal. Estimated Blood Loss:     Estimated blood loss was minimal. Impression:               - Esophagogastric landmarks identified.                           - Z-line irregular. Biopsied.                           - Normal esophagus otherwise - empiric dilation  done to 1mm given symptoms of dysphagia                           - Multiple gastric polyps. Suspect benign fundic                            gland polyps, one representative sample resected                            and retrieved.                           - Normal stomach otherwise                           - Duodenal diverticulum.                           - Normal duodenum otherwise Recommendation:           - Patient has a contact number available for                            emergencies. The signs and symptoms of potential                            delayed complications were discussed with the                            patient. Return to normal activities tomorrow.                            Written discharge instructions were provided to the                            patient.                           - Resume previous diet.                           - Continue present medications.                           - Await pathology results and course post-dilation Puja Caffey P. Himmat Enberg, MD 04/07/2021 2:33:24 PM This report has been signed electronically.

## 2021-04-07 NOTE — Op Note (Signed)
[facility] Patient Name: Shahara Hartsfield Procedure Date: 04/07/2021 1:53 PM MRN: [id number] Endoscopist: Remo Lipps P. Havery Moros , MD Age: 75 Referring MD:  Date of Birth: September 25, 1946 Gender: Female Account #: [id number] Procedure:                Colonoscopy Indications:              High risk colon cancer surveillance: Personal                            history of colonic polyps (2 sessile serrated                            polyps on exam in 01/2016) Medicines:                Monitored Anesthesia Care Procedure:                Pre-Anesthesia Assessment:                           - Prior to the procedure, a History and Physical                            was performed, and patient medications and                            allergies were reviewed. The patient's tolerance of                            previous anesthesia was also reviewed. The risks                            and benefits of the procedure and the sedation                            options and risks were discussed with the patient.                            All questions were answered, and informed consent                            was obtained. Prior Anticoagulants: The patient has                            taken no previous anticoagulant or antiplatelet                            agents. ASA Grade Assessment: II - A patient with                            mild systemic disease. After reviewing the risks                            and benefits, the patient was deemed in  satisfactory condition to undergo the procedure.                           After obtaining informed consent, the colonoscope                            was passed under direct vision. Throughout the                            procedure, the patient's blood pressure, pulse, and                            oxygen saturations were monitored continuously. The                            [id number] was introduced through the  anus and                            advanced to the the cecum, identified by                            appendiceal orifice and ileocecal valve. The                            colonoscopy was performed without difficulty. The                            patient tolerated the procedure well. The quality                            of the bowel preparation was good. The ileocecal                            valve, appendiceal orifice, and rectum were                            photographed. Scope In: 2:09:08 PM Scope Out: 2:23:36 PM Scope Withdrawal Time: 0 hours 11 minutes 12 seconds  Total Procedure Duration: 0 hours 14 minutes 28 seconds  Findings:                 The perianal and digital rectal examinations were                            normal.                           A 4 mm polyp was found in the cecum. The polyp was                            flat. The polyp was removed with a cold snare.                            Resection and retrieval were complete.  A 4 mm polyp was found in the ascending colon. The                            polyp was flat. The polyp was removed with a cold                            snare. Resection and retrieval were complete.                           Multiple medium-mouthed diverticula were found in                            the sigmoid colon.                           The exam was otherwise without abnormality. Complications:            No immediate complications. Estimated blood loss:                            Minimal. Estimated Blood Loss:     Estimated blood loss was minimal. Impression:               - One 4 mm polyp in the cecum, removed with a cold                            snare. Resected and retrieved.                           - One 4 mm polyp in the ascending colon, removed                            with a cold snare. Resected and retrieved.                           - Diverticulosis in the sigmoid colon.                            - The examination was otherwise normal. Recommendation:           - Patient has a contact number available for                            emergencies. The signs and symptoms of potential                            delayed complications were discussed with the                            patient. Return to normal activities tomorrow.                            Written discharge instructions were provided to the  patient.                           - Resume previous diet.                           - Continue present medications.                           - Await pathology results. Remo Lipps P. Benedetta Sundstrom, MD 04/07/2021 2:27:35 PM This report has been signed electronically.

## 2021-04-07 NOTE — Progress Notes (Signed)
No medical changes. Vitals- Andrea Carroll 

## 2021-04-07 NOTE — Progress Notes (Signed)
Called to room to assist during endoscopic procedure.  Patient ID and intended procedure confirmed with present staff. Received instructions for my participation in the procedure from the performing physician.  

## 2021-04-09 ENCOUNTER — Telehealth: Payer: Self-pay

## 2021-04-09 NOTE — Telephone Encounter (Signed)
  Follow up Call-  Call back number 04/07/2021  Post procedure Call Back phone  # [phone]  Permission to leave phone message Yes  Some recent data might be hidden     Patient questions:  Do you have a fever, pain , or abdominal swelling? No. Pain Score  0 *  Have you tolerated food without any problems? Yes.    Have you been able to return to your normal activities? Yes.    Do you have any questions about your discharge instructions: Diet   No. Medications  No. Follow up visit  No.  Do you have questions or concerns about your Care? No.  Actions: * If pain score is 4 or above: No action needed, pain <4.  1. Have you developed a fever since your procedure? no  2.   Have you had an respiratory symptoms (SOB or cough) since your procedure? no  3.   Have you tested positive for COVID 19 since your procedure no  4.   Have you had any family members/close contacts diagnosed with the COVID 19 since your procedure?  no   If yes to any of these questions please route to Joylene John, RN and Joella Prince, RN

## 2021-04-27 ENCOUNTER — Telehealth: Payer: Self-pay

## 2021-04-27 DIAGNOSIS — Z006 Encounter for examination for normal comparison and control in clinical research program: Secondary | ICD-10-CM

## 2021-04-27 NOTE — Telephone Encounter (Signed)
I called patient for her 90-day Identify Study follow up phone call. Patient is doing well with no cardiac symptoms at this time. I reminded patient I would call her in January for her 1 year follow-up.

## 2021-05-06 DIAGNOSIS — H26491 Other secondary cataract, right eye: Secondary | ICD-10-CM | POA: Diagnosis not present

## 2021-05-06 DIAGNOSIS — H04123 Dry eye syndrome of bilateral lacrimal glands: Secondary | ICD-10-CM | POA: Diagnosis not present

## 2021-05-06 DIAGNOSIS — Z961 Presence of intraocular lens: Secondary | ICD-10-CM | POA: Diagnosis not present

## 2021-05-06 DIAGNOSIS — D3132 Benign neoplasm of left choroid: Secondary | ICD-10-CM | POA: Diagnosis not present

## 2021-07-07 DIAGNOSIS — E785 Hyperlipidemia, unspecified: Secondary | ICD-10-CM | POA: Diagnosis not present

## 2021-07-07 DIAGNOSIS — M859 Disorder of bone density and structure, unspecified: Secondary | ICD-10-CM | POA: Diagnosis not present

## 2021-07-14 DIAGNOSIS — L820 Inflamed seborrheic keratosis: Secondary | ICD-10-CM | POA: Diagnosis not present

## 2021-07-14 DIAGNOSIS — R82998 Other abnormal findings in urine: Secondary | ICD-10-CM | POA: Diagnosis not present

## 2021-07-14 DIAGNOSIS — M1990 Unspecified osteoarthritis, unspecified site: Secondary | ICD-10-CM | POA: Diagnosis not present

## 2021-07-14 DIAGNOSIS — D1801 Hemangioma of skin and subcutaneous tissue: Secondary | ICD-10-CM | POA: Diagnosis not present

## 2021-07-14 DIAGNOSIS — D2262 Melanocytic nevi of left upper limb, including shoulder: Secondary | ICD-10-CM | POA: Diagnosis not present

## 2021-07-14 DIAGNOSIS — L309 Dermatitis, unspecified: Secondary | ICD-10-CM | POA: Diagnosis not present

## 2021-07-14 DIAGNOSIS — N3281 Overactive bladder: Secondary | ICD-10-CM | POA: Diagnosis not present

## 2021-07-14 DIAGNOSIS — F439 Reaction to severe stress, unspecified: Secondary | ICD-10-CM | POA: Diagnosis not present

## 2021-07-14 DIAGNOSIS — Z0000 Encounter for general adult medical examination without abnormal findings: Secondary | ICD-10-CM | POA: Diagnosis not present

## 2021-07-14 DIAGNOSIS — D485 Neoplasm of uncertain behavior of skin: Secondary | ICD-10-CM | POA: Diagnosis not present

## 2021-07-14 DIAGNOSIS — L821 Other seborrheic keratosis: Secondary | ICD-10-CM | POA: Diagnosis not present

## 2021-07-14 DIAGNOSIS — D2272 Melanocytic nevi of left lower limb, including hip: Secondary | ICD-10-CM | POA: Diagnosis not present

## 2021-07-14 DIAGNOSIS — E785 Hyperlipidemia, unspecified: Secondary | ICD-10-CM | POA: Diagnosis not present

## 2021-07-14 DIAGNOSIS — D2261 Melanocytic nevi of right upper limb, including shoulder: Secondary | ICD-10-CM | POA: Diagnosis not present

## 2021-07-14 DIAGNOSIS — M8580 Other specified disorders of bone density and structure, unspecified site: Secondary | ICD-10-CM | POA: Diagnosis not present

## 2021-07-14 DIAGNOSIS — C44612 Basal cell carcinoma of skin of right upper limb, including shoulder: Secondary | ICD-10-CM | POA: Diagnosis not present

## 2021-07-14 DIAGNOSIS — K219 Gastro-esophageal reflux disease without esophagitis: Secondary | ICD-10-CM | POA: Diagnosis not present

## 2021-07-18 ENCOUNTER — Encounter: Payer: Self-pay | Admitting: Gastroenterology

## 2021-08-04 DIAGNOSIS — M25551 Pain in right hip: Secondary | ICD-10-CM | POA: Diagnosis not present

## 2021-08-04 DIAGNOSIS — M545 Low back pain, unspecified: Secondary | ICD-10-CM | POA: Diagnosis not present

## 2021-08-04 DIAGNOSIS — M25552 Pain in left hip: Secondary | ICD-10-CM | POA: Diagnosis not present

## 2021-08-05 DIAGNOSIS — M8589 Other specified disorders of bone density and structure, multiple sites: Secondary | ICD-10-CM | POA: Diagnosis not present

## 2021-08-23 DIAGNOSIS — M545 Low back pain, unspecified: Secondary | ICD-10-CM | POA: Diagnosis not present

## 2021-08-25 DIAGNOSIS — M5416 Radiculopathy, lumbar region: Secondary | ICD-10-CM | POA: Diagnosis not present

## 2021-08-30 DIAGNOSIS — M5416 Radiculopathy, lumbar region: Secondary | ICD-10-CM | POA: Diagnosis not present

## 2021-09-02 DIAGNOSIS — M5416 Radiculopathy, lumbar region: Secondary | ICD-10-CM | POA: Diagnosis not present

## 2021-09-06 DIAGNOSIS — M5416 Radiculopathy, lumbar region: Secondary | ICD-10-CM | POA: Diagnosis not present

## 2021-09-07 DIAGNOSIS — Z23 Encounter for immunization: Secondary | ICD-10-CM | POA: Diagnosis not present

## 2021-09-07 DIAGNOSIS — M8580 Other specified disorders of bone density and structure, unspecified site: Secondary | ICD-10-CM | POA: Diagnosis not present

## 2021-09-07 DIAGNOSIS — K219 Gastro-esophageal reflux disease without esophagitis: Secondary | ICD-10-CM | POA: Diagnosis not present

## 2021-09-09 DIAGNOSIS — M5416 Radiculopathy, lumbar region: Secondary | ICD-10-CM | POA: Diagnosis not present

## 2021-09-13 DIAGNOSIS — M5416 Radiculopathy, lumbar region: Secondary | ICD-10-CM | POA: Diagnosis not present

## 2021-09-14 DIAGNOSIS — M5416 Radiculopathy, lumbar region: Secondary | ICD-10-CM | POA: Diagnosis not present

## 2021-09-17 DIAGNOSIS — M5416 Radiculopathy, lumbar region: Secondary | ICD-10-CM | POA: Diagnosis not present

## 2021-10-05 DIAGNOSIS — M545 Low back pain, unspecified: Secondary | ICD-10-CM | POA: Diagnosis not present

## 2021-10-18 DIAGNOSIS — R921 Mammographic calcification found on diagnostic imaging of breast: Secondary | ICD-10-CM | POA: Diagnosis not present

## 2021-10-18 DIAGNOSIS — R922 Inconclusive mammogram: Secondary | ICD-10-CM | POA: Diagnosis not present

## 2021-11-10 DIAGNOSIS — Z124 Encounter for screening for malignant neoplasm of cervix: Secondary | ICD-10-CM | POA: Diagnosis not present

## 2021-11-10 DIAGNOSIS — Z6823 Body mass index (BMI) 23.0-23.9, adult: Secondary | ICD-10-CM | POA: Diagnosis not present

## 2021-11-10 DIAGNOSIS — Z90711 Acquired absence of uterus with remaining cervical stump: Secondary | ICD-10-CM | POA: Diagnosis not present

## 2021-12-24 DIAGNOSIS — J029 Acute pharyngitis, unspecified: Secondary | ICD-10-CM | POA: Diagnosis not present

## 2021-12-24 DIAGNOSIS — R059 Cough, unspecified: Secondary | ICD-10-CM | POA: Diagnosis not present

## 2021-12-24 DIAGNOSIS — R0981 Nasal congestion: Secondary | ICD-10-CM | POA: Diagnosis not present

## 2021-12-24 DIAGNOSIS — J069 Acute upper respiratory infection, unspecified: Secondary | ICD-10-CM | POA: Diagnosis not present

## 2021-12-24 DIAGNOSIS — R5383 Other fatigue: Secondary | ICD-10-CM | POA: Diagnosis not present

## 2021-12-24 DIAGNOSIS — Z1152 Encounter for screening for COVID-19: Secondary | ICD-10-CM | POA: Diagnosis not present

## 2022-01-12 DIAGNOSIS — R6889 Other general symptoms and signs: Secondary | ICD-10-CM | POA: Diagnosis not present

## 2022-01-12 DIAGNOSIS — R202 Paresthesia of skin: Secondary | ICD-10-CM | POA: Diagnosis not present

## 2022-01-12 DIAGNOSIS — M48061 Spinal stenosis, lumbar region without neurogenic claudication: Secondary | ICD-10-CM | POA: Diagnosis not present

## 2022-02-02 ENCOUNTER — Encounter: Payer: Self-pay | Admitting: Neurology

## 2022-02-11 ENCOUNTER — Other Ambulatory Visit: Payer: Self-pay | Admitting: Gastroenterology

## 2022-02-21 ENCOUNTER — Encounter: Payer: Self-pay | Admitting: Neurology

## 2022-02-21 ENCOUNTER — Other Ambulatory Visit: Payer: Self-pay

## 2022-02-21 ENCOUNTER — Ambulatory Visit (INDEPENDENT_AMBULATORY_CARE_PROVIDER_SITE_OTHER): Payer: Medicare Other | Admitting: Neurology

## 2022-02-21 VITALS — BP 126/66 | HR 75 | Ht 62.0 in | Wt 128.0 lb

## 2022-02-21 DIAGNOSIS — M79602 Pain in left arm: Secondary | ICD-10-CM | POA: Diagnosis not present

## 2022-02-21 DIAGNOSIS — M79601 Pain in right arm: Secondary | ICD-10-CM

## 2022-02-21 DIAGNOSIS — R202 Paresthesia of skin: Secondary | ICD-10-CM | POA: Diagnosis not present

## 2022-02-21 NOTE — Progress Notes (Signed)
?[facility] ?Neurology Division ?Clinic Note - Initial Visit ? ? ?Date: 02/21/22 ? ?Andrea Carroll ?MRN: [id number] ?DOB: 09-08-1946 ? ? ?Dear Andrea Shackleton, NP: ? ?Thank you for your kind referral of Andrea Carroll for consultation of paresthesias. Although her history is well known to you, please allow [place] to reiterate it for the purpose of our medical record. The patient was accompanied to the clinic by self. ?  ? ?History of Present Illness: ?Andrea Carroll is a 76 y.o. right-handed female with GERD and hyperlipidemia presenting for evaluation of whole body tingling.   Starting around early November 2022, she she began having spells of cold sensation/tingling starting in the hands and moving up the arms and then down the legs and feet.  It comes as a wave and lasts a few seconds, but occurs multiple times a day.  She was taking alendronate and stopped the medication, but did not see any improvement.  It is worse with sneezing.  She also complains of patchy areas of tingling and burning over the arms and legs, this is also very transient.  None of her symptoms are constant. She walks unassisted.  No imbalance, weakness, or falls.  ? ?She denies facial paresthesias or weakness.   ? ?Past Medical History:  ?Diagnosis Date  ? Cataract   ? bialteral  ? DDD (degenerative disc disease), cervical   ? also lower back  ? GERD (gastroesophageal reflux disease)   ? Hyperlipidemia   ? diet controlled, no medications  ? ? ?Past Surgical History:  ?Procedure Laterality Date  ? ABDOMINAL HYSTERECTOMY    ? biospy surgery Right   ? breast  ? CHOLECYSTECTOMY N/A 01/06/2015  ? Procedure: LAPAROSCOPIC CHOLECYSTECTOMY WITH INTRAOPERATIVE CHOLANGIOGRAM;  Surgeon: Erroll Luna, MD;  Location: [facility];  Service: General;  Laterality: N/A;  ? COLONOSCOPY  11/2005  ? [facility]  ? ESOPHAGOGASTRODUODENOSCOPY    ? Dr Lajoyce Corners  ? SHOULDER SURGERY Left   ? ? ? ?Medications:  ?Outpatient Encounter Medications as of 02/21/2022  ?Medication Sig  ?  Acetaminophen 325 MG CAPS Take 325 mg by mouth as needed (for pain).  ? Ascorbic Acid (VITAMIN C) 500 MG CAPS take one tab by mouth once daily  ? calcium carbonate (OS-CAL) 600 MG TABS tablet Take 600 mg by mouth daily.  ? Cholecalciferol (VITAMIN D3) 50 MCG (2000 UT) CAPS Take by mouth. Four times daily  ? Glucosamine-Chondroit-Vit C-Mn (GLUCOSAMINE 1500 COMPLEX) CAPS Take 1 capsule by mouth daily.   ? omeprazole (PRILOSEC) 20 MG capsule Take 1 capsule by mouth once daily  ? Turmeric (QC TUMERIC COMPLEX PO) Take 1 tablet by mouth daily. With Ginger  ? Zinc 50 MG CAPS Take by mouth daily.  ? Multiple Vitamin (MULTI VITAMIN DAILY PO) Take 1 tablet by mouth daily.  (Patient not taking: Reported on 02/21/2022)  ? mupirocin ointment (BACTROBAN) 2 % apply to affected area BID (Patient not taking: Reported on 02/21/2022)  ? nitrofurantoin, macrocrystal-monohydrate, (MACROBID) 100 MG capsule Take 1 capsule by mouth 2 (two) times daily. (Patient not taking: Reported on 04/07/2021)  ? tolterodine (DETROL LA) 4 MG 24 hr capsule take one cap by mouth twice a week (Patient not taking: Reported on 04/07/2021)  ? traMADol (ULTRAM) 50 MG tablet Take 1-2 tablets (50-100 mg total) by mouth every 6 (six) hours as needed for moderate pain. (Patient not taking: Reported on 04/07/2021)  ? Vitamin D, Ergocalciferol, (DRISDOL) 50000 UNITS CAPS capsule Take 50,000 Units by mouth every other  day. friday (Patient not taking: Reported on 02/21/2022)  ? [DISCONTINUED] aspirin 81 MG EC tablet Take one (1) tablet by mouth three times a week (Patient not taking: Reported on 04/07/2021)  ? ?Facility-Administered Encounter Medications as of 02/21/2022  ?Medication  ? lidocaine-prilocaine (EMLA) cream  ? ? ?Allergies:  ?Allergies  ?Allergen Reactions  ? Sulfa Antibiotics   ?  rash  ? ? ?Family History: ?Family History  ?Problem Relation Age of Onset  ? Alzheimer's disease Mother   ? Other Father   ?     brain tumor, died at age 11  ? Colon cancer Neg Hx   ?  Colon polyps Neg Hx   ? Esophageal cancer Neg Hx   ? Rectal cancer Neg Hx   ? Stomach cancer Neg Hx   ? ? ?Social History: ?Social History  ? ?Tobacco Use  ? Smoking status: Former  ?  Packs/day: 0.50  ?  Types: Cigarettes  ?  Quit date: 12/30/1967  ?  Years since quitting: 54.1  ? Smokeless tobacco: Never  ?Vaping Use  ? Vaping Use: Never used  ?Substance Use Topics  ? Alcohol use: No  ?  Alcohol/week: 0.0 standard drinks  ? Drug use: No  ? ?Social History  ? ?Social History Narrative  ? Right Handed   ? Lives in a two story home   ? ? ?Vital Signs:  ?BP 126/66   Pulse 75   Ht '5\' 2"'$  (1.575 m)   Wt 128 lb (58.1 kg)   BMI 23.41 kg/m?  ? ?Neurological Exam: ?MENTAL STATUS including orientation to time, place, person, recent and remote memory, attention span and concentration, language, and fund of knowledge is normal.  Speech is not dysarthric. ? ?CRANIAL NERVES: ?II:  No visual field defects.  ?III-IV-VI: Pupils equal round and reactive to light.  Normal conjugate, extra-ocular eye movements in all directions of gaze.  No nystagmus.  No ptosis.   ?V:  Normal facial sensation.    ?VII:  Normal facial symmetry and movements.   ?VIII:  Normal hearing and vestibular function.   ?IX-X:  Normal palatal movement.   ?XI:  Normal shoulder shrug and head rotation.   ?XII:  Normal tongue strength and range of motion, no deviation or fasciculation. ? ?MOTOR:  No atrophy, fasciculations or abnormal movements.  No pronator drift.  ? ?Upper Extremity:  Right  Left  ?Deltoid  5/5   5/5   ?Biceps  5/5   5/5   ?Triceps  5/5   5/5   ?Wrist extensors  5/5   5/5   ?Wrist flexors  5/5   5/5   ?Finger extensors  5/5   5/5   ?Finger flexors  5/5   5/5   ?Dorsal interossei  5/5   5/5   ?Abductor pollicis  5/5   5/5   ?Tone (Ashworth scale)  0  0  ? ?Lower Extremity:  Right  Left  ?Hip flexors  5/5   5/5   ?Knee flexors  5/5   5/5   ?Knee extensors  5/5   5/5   ?Dorsiflexors  5/5   5/5   ?Plantarflexors  5/5   5/5   ?Toe extensors  5/5    5/5   ?Toe flexors  5/5   5/5   ?Tone (Ashworth scale)  0  0  ? ?MSRs:  ?Right        Left                  ?  brachioradialis 2+  2+  ?biceps 2+  2+  ?triceps 2+  2+  ?patellar 2+  2+  ?ankle jerk 2+  2+  ?Hoffman no  no  ?plantar response down  down  ? ?SENSORY:  Normal and symmetric perception of light touch, pinprick, vibration, and proprioception.   ? ?COORDINATION/GAIT: Normal finger-to- nose-finger.  Intact rapid alternating movements bilaterally.    Gait narrow based and stable, assisted with a cane. ? ? ?IMPRESSION: ?Migratory paresthesias of the arms and legs, in the setting of normal neurological exam.  The likelihood of primary nerve pathology to cause these periodic symptoms is very low. Patient is not reassured by today's exam and would like additional work-up.  I will order MRI cervical spine to see if there is any cord pathology to explain her symptoms, however, the fact there there is no upper motor neuron findings on her exam, makes this unlikely. ? ? ?Thank you for allowing me to participate in patient's care.  If I can answer any additional questions, I would be pleased to do so.   ? ?Sincerely, ? ? ? ?Bess Saltzman K. Posey Pronto, DO ? ?

## 2022-02-21 NOTE — Patient Instructions (Addendum)
MRI cervical spine without contrast  We will call you with the results 

## 2022-02-25 ENCOUNTER — Ambulatory Visit
Admission: RE | Admit: 2022-02-25 | Discharge: 2022-02-25 | Disposition: A | Discharge status: Home / Self Care | Payer: Medicare Other | Source: Ambulatory Visit | Attending: Neurology | Admitting: Neurology

## 2022-02-25 DIAGNOSIS — R202 Paresthesia of skin: Secondary | ICD-10-CM

## 2022-02-25 DIAGNOSIS — R200 Anesthesia of skin: Secondary | ICD-10-CM | POA: Diagnosis not present

## 2022-02-25 DIAGNOSIS — M79601 Pain in right arm: Secondary | ICD-10-CM

## 2022-02-25 DIAGNOSIS — M2578 Osteophyte, vertebrae: Secondary | ICD-10-CM | POA: Diagnosis not present

## 2022-02-25 DIAGNOSIS — M47812 Spondylosis without myelopathy or radiculopathy, cervical region: Secondary | ICD-10-CM | POA: Diagnosis not present

## 2022-02-28 ENCOUNTER — Telehealth: Payer: Self-pay | Admitting: Neurology

## 2022-02-28 DIAGNOSIS — R202 Paresthesia of skin: Secondary | ICD-10-CM

## 2022-02-28 NOTE — Telephone Encounter (Signed)
Pt called in returning a call about Results ?

## 2022-02-28 NOTE — Telephone Encounter (Signed)
Orders placed for EMG

## 2022-03-10 ENCOUNTER — Ambulatory Visit (INDEPENDENT_AMBULATORY_CARE_PROVIDER_SITE_OTHER): Payer: Medicare Other | Admitting: Neurology

## 2022-03-10 DIAGNOSIS — R202 Paresthesia of skin: Secondary | ICD-10-CM

## 2022-03-10 NOTE — Procedures (Signed)
[facility]  ?[address] ? [place], [place] [address] ?Tel: [phone] ?Fax:  [phone] ?Test Date:  03/10/2022 ? ?Patient: Andrea Carroll DOB: 1945-12-24 Physician: Narda Amber, DO  ?Sex: Female Height: '5\' 2"'$  Ref Phys: Narda Amber, DO  ?ID#: [id number]   Technician:   ? ?Patient Complaints: ?This is a 76 year old female referred for evaluation of generalized paresthesias of the arms and legs. ? ?NCV & EMG Findings: ?Extensive electrodiagnostic testing of the right upper and lower extremity shows: ?Right median, ulnar, sural, and superficial peroneal sensory responses are within normal limits. ?Right median, ulnar, peroneal, and tibial motor responses are within normal limits. ?Right tibial H reflex study is within normal limits. ?There is no evidence of active or chronic motor axonal loss changes affecting any of the tested muscles.  Motor unit configuration and recruitment pattern is within normal limits. ? ?Impression: ?This is a normal study of the right upper and lower extremities.  In particular, there is no evidence of a sensorimotor polyneuropathy or cervical/lumbosacral radiculopathy. ? ? ?___________________________ ?Narda Amber, DO ? ? ? ?Nerve Conduction Studies ?Anti Sensory Summary Table ? ? Stim Site NR Peak (ms) Norm Peak (ms) P-T Amp (?V) Norm P-T Amp  ?Right Median Anti Sensory (2nd Digit)  33?C  ?Wrist    2.3 <3.8 45.1 >10  ?Right Sup Peroneal Anti Sensory (Ant Lat Mall)  33?C  ?12 cm    2.3 <4.6 8.6 >3  ?Right Sural Anti Sensory (Lat Mall)  33?C  ?Calf    3.1 <4.6 11.9 >3  ?Right Ulnar Anti Sensory (5th Digit)  33?C  ?Wrist    2.2 <3.2 13.7 >5  ? ?Motor Summary Table ? ? Stim Site NR Onset (ms) Norm Onset (ms) O-P Amp (mV) Norm O-P Amp Site1 Site2 Delta-0 (ms) Dist (cm) Vel (m/s) Norm Vel (m/s)  ?Right Median Motor (Abd Poll Brev)  33?C  ?Wrist    2.5 <4.0 8.1 >5 Elbow Wrist 3.8 25.0 66 >50  ?Elbow    6.3  7.7         ?Right Peroneal Motor (Ext Dig Brev)  33?C   ?Ankle    3.5 <6.0 3.4 >2.5 B Fib Ankle 6.3 33.0 52 >40  ?B Fib    9.8  3.2  Poplt B Fib 1.2 7.0 58 >40  ?Poplt    11.0  3.1         ?Right Tibial Motor (Abd [place] Crane Brev)  33?C  ?Ankle    3.3 <6.0 10.4 >4 Knee Ankle 7.1 38.0 54 >40  ?Knee    10.4  6.0         ?Right Ulnar Motor (Abd Dig Minimi)  33?C  ?Wrist    2.0 <3.1 8.1 >7 B Elbow Wrist 3.2 21.0 66 >50  ?B Elbow    5.2  7.7  A Elbow B Elbow 1.6 10.0 63 >50  ?A Elbow    6.8  6.0         ? ?H Reflex Studies ? ? NR H-Lat (ms) Lat Norm (ms) L-R H-Lat (ms)  ?Right Tibial (Gastroc)  33?C  ?   33.06 <35   ? ?EMG ? ? Side Muscle Ins Act Fibs Psw Fasc Number Recrt Dur Dur. Amp Amp. Poly Poly. Comment  ?Right AntTibialis Nml Nml Nml Nml Nml Nml Nml Nml Nml Nml Nml Nml N/A  ?Right Gastroc Nml Nml Nml Nml Nml Nml Nml Nml Nml Nml Nml Nml N/A  ?Right Flex Dig Long  Nml Nml Nml Nml Nml Nml Nml Nml Nml Nml Nml Nml N/A  ?Right RectFemoris Nml Nml Nml Nml Nml Nml Nml Nml Nml Nml Nml Nml N/A  ?Right GluteusMed Nml Nml Nml Nml Nml Nml Nml Nml Nml Nml Nml Nml N/A  ?Right 1stDorInt Nml Nml Nml Nml Nml Nml Nml Nml Nml Nml Nml Nml N/A  ?Right PronatorTeres Nml Nml Nml Nml Nml Nml Nml Nml Nml Nml Nml Nml N/A  ?Right Biceps Nml Nml Nml Nml Nml Nml Nml Nml Nml Nml Nml Nml N/A  ?Right Triceps Nml Nml Nml Nml Nml Nml Nml Nml Nml Nml Nml Nml N/A  ?Right Deltoid Nml Nml Nml Nml Nml Nml Nml Nml Nml Nml Nml Nml N/A  ? ? ? ? ?Waveforms: ?    ? ?    ? ?    ? ? ?

## 2022-03-15 ENCOUNTER — Telehealth: Payer: Self-pay | Admitting: Neurology

## 2022-03-15 DIAGNOSIS — M79601 Pain in right arm: Secondary | ICD-10-CM

## 2022-03-15 DIAGNOSIS — R202 Paresthesia of skin: Secondary | ICD-10-CM

## 2022-03-15 NOTE — Telephone Encounter (Signed)
Pt called in stating she was interested in getting the MRI, but she has a few questions about it first.  ?

## 2022-03-17 NOTE — Telephone Encounter (Signed)
Called pateint and answered questions she had about MRI she would like to move forward. Anything I can do to help with this ? ?

## 2022-03-17 NOTE — Telephone Encounter (Signed)
MRI has been sent to Andrea Carroll.  ?

## 2022-03-23 ENCOUNTER — Ambulatory Visit
Admission: RE | Admit: 2022-03-23 | Discharge: 2022-03-23 | Disposition: A | Discharge status: Home / Self Care | Payer: Medicare Other | Source: Ambulatory Visit | Attending: Neurology | Admitting: Neurology

## 2022-03-23 DIAGNOSIS — R200 Anesthesia of skin: Secondary | ICD-10-CM | POA: Diagnosis not present

## 2022-03-23 DIAGNOSIS — R202 Paresthesia of skin: Secondary | ICD-10-CM | POA: Diagnosis not present

## 2022-03-23 DIAGNOSIS — M79602 Pain in left arm: Secondary | ICD-10-CM

## 2022-03-23 DIAGNOSIS — I6782 Cerebral ischemia: Secondary | ICD-10-CM | POA: Diagnosis not present

## 2022-03-29 ENCOUNTER — Telehealth: Payer: Self-pay

## 2022-03-29 NOTE — Telephone Encounter (Signed)
Pt called an informed that MRI looked fine, no tumor, stroke, or bleed. It showed age-related changes ?

## 2022-03-29 NOTE — Telephone Encounter (Signed)
-----   Message from Cameron Sprang, MD sent at 03/23/2022  3:00 PM EDT ----- ?Pls let her know brain MRI looked fine, no tumor, stroke, or bleed. It showed age-related changes. Thanks ?

## 2022-05-16 DIAGNOSIS — Z961 Presence of intraocular lens: Secondary | ICD-10-CM | POA: Diagnosis not present

## 2022-05-16 DIAGNOSIS — D3132 Benign neoplasm of left choroid: Secondary | ICD-10-CM | POA: Diagnosis not present

## 2022-05-16 DIAGNOSIS — H26492 Other secondary cataract, left eye: Secondary | ICD-10-CM | POA: Diagnosis not present

## 2022-05-16 DIAGNOSIS — H04123 Dry eye syndrome of bilateral lacrimal glands: Secondary | ICD-10-CM | POA: Diagnosis not present

## 2022-07-18 DIAGNOSIS — Z85828 Personal history of other malignant neoplasm of skin: Secondary | ICD-10-CM | POA: Diagnosis not present

## 2022-07-18 DIAGNOSIS — L570 Actinic keratosis: Secondary | ICD-10-CM | POA: Diagnosis not present

## 2022-07-18 DIAGNOSIS — D2261 Melanocytic nevi of right upper limb, including shoulder: Secondary | ICD-10-CM | POA: Diagnosis not present

## 2022-07-18 DIAGNOSIS — D225 Melanocytic nevi of trunk: Secondary | ICD-10-CM | POA: Diagnosis not present

## 2022-07-18 DIAGNOSIS — D0439 Carcinoma in situ of skin of other parts of face: Secondary | ICD-10-CM | POA: Diagnosis not present

## 2022-07-18 DIAGNOSIS — L98499 Non-pressure chronic ulcer of skin of other sites with unspecified severity: Secondary | ICD-10-CM | POA: Diagnosis not present

## 2022-07-18 DIAGNOSIS — L821 Other seborrheic keratosis: Secondary | ICD-10-CM | POA: Diagnosis not present

## 2022-07-18 DIAGNOSIS — D1801 Hemangioma of skin and subcutaneous tissue: Secondary | ICD-10-CM | POA: Diagnosis not present

## 2022-07-18 DIAGNOSIS — D485 Neoplasm of uncertain behavior of skin: Secondary | ICD-10-CM | POA: Diagnosis not present

## 2022-07-26 ENCOUNTER — Encounter: Payer: Medicare Other | Admitting: Neurology

## 2022-07-27 DIAGNOSIS — D0439 Carcinoma in situ of skin of other parts of face: Secondary | ICD-10-CM | POA: Diagnosis not present

## 2022-07-27 DIAGNOSIS — Z85828 Personal history of other malignant neoplasm of skin: Secondary | ICD-10-CM | POA: Diagnosis not present

## 2022-07-29 DIAGNOSIS — R202 Paresthesia of skin: Secondary | ICD-10-CM | POA: Diagnosis not present

## 2022-07-29 DIAGNOSIS — R7989 Other specified abnormal findings of blood chemistry: Secondary | ICD-10-CM | POA: Diagnosis not present

## 2022-07-29 DIAGNOSIS — M8589 Other specified disorders of bone density and structure, multiple sites: Secondary | ICD-10-CM | POA: Diagnosis not present

## 2022-07-29 DIAGNOSIS — Z0000 Encounter for general adult medical examination without abnormal findings: Secondary | ICD-10-CM | POA: Diagnosis not present

## 2022-07-29 DIAGNOSIS — M8580 Other specified disorders of bone density and structure, unspecified site: Secondary | ICD-10-CM | POA: Diagnosis not present

## 2022-07-29 DIAGNOSIS — E785 Hyperlipidemia, unspecified: Secondary | ICD-10-CM | POA: Diagnosis not present

## 2022-08-05 ENCOUNTER — Other Ambulatory Visit: Payer: Self-pay | Admitting: Internal Medicine

## 2022-08-05 DIAGNOSIS — N3281 Overactive bladder: Secondary | ICD-10-CM | POA: Diagnosis not present

## 2022-08-05 DIAGNOSIS — F439 Reaction to severe stress, unspecified: Secondary | ICD-10-CM | POA: Diagnosis not present

## 2022-08-05 DIAGNOSIS — R109 Unspecified abdominal pain: Secondary | ICD-10-CM | POA: Diagnosis not present

## 2022-08-05 DIAGNOSIS — E785 Hyperlipidemia, unspecified: Secondary | ICD-10-CM | POA: Diagnosis not present

## 2022-08-05 DIAGNOSIS — Z0000 Encounter for general adult medical examination without abnormal findings: Secondary | ICD-10-CM | POA: Diagnosis not present

## 2022-08-05 DIAGNOSIS — R82998 Other abnormal findings in urine: Secondary | ICD-10-CM | POA: Diagnosis not present

## 2022-08-05 DIAGNOSIS — M1990 Unspecified osteoarthritis, unspecified site: Secondary | ICD-10-CM | POA: Diagnosis not present

## 2022-08-05 DIAGNOSIS — M8580 Other specified disorders of bone density and structure, unspecified site: Secondary | ICD-10-CM | POA: Diagnosis not present

## 2022-08-05 DIAGNOSIS — M48061 Spinal stenosis, lumbar region without neurogenic claudication: Secondary | ICD-10-CM | POA: Diagnosis not present

## 2022-08-05 DIAGNOSIS — Z1339 Encounter for screening examination for other mental health and behavioral disorders: Secondary | ICD-10-CM | POA: Diagnosis not present

## 2022-08-05 DIAGNOSIS — K219 Gastro-esophageal reflux disease without esophagitis: Secondary | ICD-10-CM | POA: Diagnosis not present

## 2022-08-05 DIAGNOSIS — Z1331 Encounter for screening for depression: Secondary | ICD-10-CM | POA: Diagnosis not present

## 2022-08-05 DIAGNOSIS — R202 Paresthesia of skin: Secondary | ICD-10-CM | POA: Diagnosis not present

## 2022-08-05 DIAGNOSIS — R6889 Other general symptoms and signs: Secondary | ICD-10-CM | POA: Diagnosis not present

## 2022-08-29 ENCOUNTER — Ambulatory Visit
Admission: RE | Admit: 2022-08-29 | Discharge: 2022-08-29 | Disposition: A | Discharge status: Home / Self Care | Payer: Medicare Other | Source: Ambulatory Visit | Attending: Internal Medicine | Admitting: Internal Medicine

## 2022-08-29 DIAGNOSIS — R109 Unspecified abdominal pain: Secondary | ICD-10-CM

## 2022-08-29 DIAGNOSIS — K5730 Diverticulosis of large intestine without perforation or abscess without bleeding: Secondary | ICD-10-CM | POA: Diagnosis not present

## 2022-08-29 DIAGNOSIS — K6389 Other specified diseases of intestine: Secondary | ICD-10-CM | POA: Diagnosis not present

## 2022-08-29 DIAGNOSIS — K314 Gastric diverticulum: Secondary | ICD-10-CM | POA: Diagnosis not present

## 2022-08-29 DIAGNOSIS — K769 Liver disease, unspecified: Secondary | ICD-10-CM | POA: Diagnosis not present

## 2022-08-29 MED ORDER — IOPAMIDOL (ISOVUE-300) INJECTION 61%
100.0000 mL | Freq: Once | INTRAVENOUS | Status: AC | PRN
  Administered 2022-08-29: 100 mL via INTRAVENOUS

## 2022-10-31 DIAGNOSIS — Z1231 Encounter for screening mammogram for malignant neoplasm of breast: Secondary | ICD-10-CM | POA: Diagnosis not present

## 2022-11-04 DIAGNOSIS — M25561 Pain in right knee: Secondary | ICD-10-CM | POA: Diagnosis not present

## 2022-11-04 DIAGNOSIS — M25562 Pain in left knee: Secondary | ICD-10-CM | POA: Diagnosis not present

## 2023-05-22 DIAGNOSIS — H43811 Vitreous degeneration, right eye: Secondary | ICD-10-CM | POA: Diagnosis not present

## 2023-05-22 DIAGNOSIS — Z961 Presence of intraocular lens: Secondary | ICD-10-CM | POA: Diagnosis not present

## 2023-05-22 DIAGNOSIS — H04123 Dry eye syndrome of bilateral lacrimal glands: Secondary | ICD-10-CM | POA: Diagnosis not present

## 2023-05-22 DIAGNOSIS — H26492 Other secondary cataract, left eye: Secondary | ICD-10-CM | POA: Diagnosis not present

## 2023-05-22 DIAGNOSIS — D3132 Benign neoplasm of left choroid: Secondary | ICD-10-CM | POA: Diagnosis not present

## 2023-06-30 DIAGNOSIS — M1990 Unspecified osteoarthritis, unspecified site: Secondary | ICD-10-CM | POA: Diagnosis not present

## 2023-06-30 DIAGNOSIS — M5380 Other specified dorsopathies, site unspecified: Secondary | ICD-10-CM | POA: Diagnosis not present

## 2023-07-19 DIAGNOSIS — D2262 Melanocytic nevi of left upper limb, including shoulder: Secondary | ICD-10-CM | POA: Diagnosis not present

## 2023-07-19 DIAGNOSIS — L821 Other seborrheic keratosis: Secondary | ICD-10-CM | POA: Diagnosis not present

## 2023-07-19 DIAGNOSIS — D225 Melanocytic nevi of trunk: Secondary | ICD-10-CM | POA: Diagnosis not present

## 2023-07-19 DIAGNOSIS — D1801 Hemangioma of skin and subcutaneous tissue: Secondary | ICD-10-CM | POA: Diagnosis not present

## 2023-07-19 DIAGNOSIS — Z85828 Personal history of other malignant neoplasm of skin: Secondary | ICD-10-CM | POA: Diagnosis not present

## 2023-07-19 DIAGNOSIS — D2261 Melanocytic nevi of right upper limb, including shoulder: Secondary | ICD-10-CM | POA: Diagnosis not present

## 2023-07-19 DIAGNOSIS — D499 Neoplasm of unspecified behavior of unspecified site: Secondary | ICD-10-CM | POA: Diagnosis not present

## 2023-07-19 DIAGNOSIS — D485 Neoplasm of uncertain behavior of skin: Secondary | ICD-10-CM | POA: Diagnosis not present

## 2023-07-19 DIAGNOSIS — L570 Actinic keratosis: Secondary | ICD-10-CM | POA: Diagnosis not present

## 2023-08-03 DIAGNOSIS — M25562 Pain in left knee: Secondary | ICD-10-CM | POA: Diagnosis not present

## 2023-08-03 DIAGNOSIS — M25552 Pain in left hip: Secondary | ICD-10-CM | POA: Diagnosis not present

## 2023-08-26 ENCOUNTER — Emergency Department (HOSPITAL_COMMUNITY): Payer: Medicare Other

## 2023-08-26 ENCOUNTER — Other Ambulatory Visit: Payer: Self-pay

## 2023-08-26 ENCOUNTER — Emergency Department (HOSPITAL_COMMUNITY)
Admission: EM | Admit: 2023-08-26 | Discharge: 2023-08-27 | Disposition: A | Discharge status: Home / Self Care | Payer: Medicare Other | Attending: Emergency Medicine | Admitting: Emergency Medicine

## 2023-08-26 DIAGNOSIS — R0789 Other chest pain: Secondary | ICD-10-CM | POA: Diagnosis not present

## 2023-08-26 DIAGNOSIS — R072 Precordial pain: Secondary | ICD-10-CM

## 2023-08-26 DIAGNOSIS — I10 Essential (primary) hypertension: Secondary | ICD-10-CM | POA: Diagnosis not present

## 2023-08-26 DIAGNOSIS — R079 Chest pain, unspecified: Secondary | ICD-10-CM | POA: Diagnosis not present

## 2023-08-26 LAB — CBC
HCT: 38.6 % (ref 36.0–46.0)
Hemoglobin: 13.1 g/dL (ref 12.0–15.0)
MCH: 30.8 pg (ref 26.0–34.0)
MCHC: 33.9 g/dL (ref 30.0–36.0)
MCV: 90.8 fL (ref 80.0–100.0)
Platelets: 276 10*3/uL (ref 150–400)
RBC: 4.25 MIL/uL (ref 3.87–5.11)
RDW: 11.7 % (ref 11.5–15.5)
WBC: 6 10*3/uL (ref 4.0–10.5)
nRBC: 0 % (ref 0.0–0.2)

## 2023-08-26 LAB — BASIC METABOLIC PANEL
Anion gap: 10 (ref 5–15)
BUN: 15 mg/dL (ref 8–23)
CO2: 27 mmol/L (ref 22–32)
Calcium: 9 mg/dL (ref 8.9–10.3)
Chloride: 98 mmol/L (ref 98–111)
Creatinine, Ser: 0.67 mg/dL (ref 0.44–1.00)
GFR, Estimated: >60 mL/min (ref >60)
Glucose, Bld: 105 mg/dL — ABNORMAL HIGH (ref 70–99)
Potassium: 3.6 mmol/L (ref 3.5–5.1)
Sodium: 135 mmol/L (ref 135–145)

## 2023-08-26 LAB — TROPONIN I (HIGH SENSITIVITY): Troponin I (High Sensitivity): <2 ng/L (ref <18)

## 2023-08-26 LAB — PROTIME-INR
INR: 1 (ref 0.8–1.2)
Prothrombin Time: 13.1 s (ref 11.4–15.2)

## 2023-08-26 NOTE — ED Triage Notes (Signed)
Patient reports persistent left chest pain with SOB for 4 days worse when lying on bed . No emesis or diaphoresis .

## 2023-08-27 DIAGNOSIS — R072 Precordial pain: Secondary | ICD-10-CM | POA: Diagnosis not present

## 2023-08-27 LAB — TROPONIN I (HIGH SENSITIVITY): Troponin I (High Sensitivity): 6 ng/L (ref <18)

## 2023-08-27 LAB — HEPATIC FUNCTION PANEL
ALT: 13 U/L (ref 0–44)
AST: 16 U/L (ref 15–41)
Albumin: 3.6 g/dL (ref 3.5–5.0)
Alkaline Phosphatase: 57 U/L (ref 38–126)
Bilirubin, Direct: <0.1 mg/dL (ref 0.0–0.2)
Total Bilirubin: 0.5 mg/dL (ref 0.3–1.2)
Total Protein: 6.3 g/dL — ABNORMAL LOW (ref 6.5–8.1)

## 2023-08-27 LAB — LIPASE, BLOOD: Lipase: 31 U/L (ref 11–51)

## 2023-08-27 LAB — D-DIMER, QUANTITATIVE: D-Dimer, Quant: 0.29 ug{FEU}/mL (ref 0.00–0.50)

## 2023-08-27 NOTE — ED Provider Notes (Signed)
[place] EMERGENCY DEPARTMENT AT [facility] Provider Note   CSN: [id number] Arrival date & time: 08/26/23  2129     History  Chief Complaint  Patient presents with   Chest Pain    Andrea Carroll is a 77 y.o. female.  The history is provided by the patient and medical records.  Chest Pain Andrea Carroll is a 77 y.o. female who presents to the Emergency Department complaining of chest pain.  She presents to the emergency department for evaluation of chest pain.  She has been experiencing episodes at night for the last 4 days when she goes to lay down with the pain and discomfort across her chest.  She will go and get up and walk around and her pain improves.  She thought it was reflux but she has been taking her medications and her pain has been worsening every night.  No swelling in her legs.  No fevers.  No cough.  No nausea, vomiting, diarrhea.  She does have occasional dyspnea on exertion.  Has a history of hyperlipidemia.  No history of diabetes, hypertension or history of DVT/PE.     Home Medications Prior to Admission medications   Medication Sig Start Date End Date Taking? Authorizing Provider  Acetaminophen 325 MG CAPS Take 325 mg by mouth as needed (for pain).    [provider]  Ascorbic Acid (VITAMIN C) 500 MG CAPS take one tab by mouth once daily 05/11/20   [provider]  calcium carbonate (OS-CAL) 600 MG TABS tablet Take 600 mg by mouth daily.    [provider]  Cholecalciferol (VITAMIN D3) 50 MCG (2000 UT) CAPS Take by mouth. Four times daily    [provider]  Glucosamine-Chondroit-Vit C-Mn (GLUCOSAMINE 1500 COMPLEX) CAPS Take 1 capsule by mouth daily.     [provider]  Multiple Vitamin (MULTI VITAMIN DAILY PO) Take 1 tablet by mouth daily.  Patient not taking: Reported on 02/21/2022    [provider]  mupirocin ointment (BACTROBAN) 2 % apply to affected area BID Patient not taking: Reported on  02/21/2022 04/24/15   [provider]  nitrofurantoin, macrocrystal-monohydrate, (MACROBID) 100 MG capsule Take 1 capsule by mouth 2 (two) times daily. Patient not taking: Reported on 04/07/2021 06/08/20   [provider]  omeprazole (PRILOSEC) 20 MG capsule Take 1 capsule by mouth once daily 02/11/22   Armbruster, Willaim Rayas, MD  tolterodine (DETROL LA) 4 MG 24 hr capsule take one cap by mouth twice a week Patient not taking: Reported on 04/07/2021 07/17/18   [provider]  traMADol (ULTRAM) 50 MG tablet Take 1-2 tablets (50-100 mg total) by mouth every 6 (six) hours as needed for moderate pain. Patient not taking: Reported on 04/07/2021 01/07/15   Barnetta Chapel, PA-C  Turmeric (QC TUMERIC COMPLEX PO) Take 1 tablet by mouth daily. With Ginger    [provider]  Vitamin D, Ergocalciferol, (DRISDOL) 50000 UNITS CAPS capsule Take 50,000 Units by mouth every other day. friday Patient not taking: Reported on 02/21/2022    [provider]  Zinc 50 MG CAPS Take by mouth daily.    [provider]      Allergies    Sulfa antibiotics    Review of Systems   Review of Systems  Cardiovascular:  Positive for chest pain.  All other systems reviewed and are negative.   Physical Exam Updated Vital Signs BP 137/65 (BP Location: Right Arm)   Pulse 63  Temp 97.8 F (36.6 C) (Oral)   Resp 20   SpO2 100%  Physical Exam Vitals and nursing note reviewed.  Constitutional:      Appearance: She is well-developed.  HENT:     Head: Normocephalic and atraumatic.  Cardiovascular:     Rate and Rhythm: Normal rate and regular rhythm.     Heart sounds: No murmur heard. Pulmonary:     Effort: Pulmonary effort is normal. No respiratory distress.     Breath sounds: Normal breath sounds.  Abdominal:     Palpations: Abdomen is soft.     Tenderness: There is no abdominal tenderness. There is no guarding or rebound.  Musculoskeletal:        General: No swelling  or tenderness.     Comments: 2+ DP pulses bilaterally  Skin:    General: Skin is warm and dry.  Neurological:     Mental Status: She is alert and oriented to person, place, and time.  Psychiatric:        Behavior: Behavior normal.     ED Results / Procedures / Treatments   Labs (all labs ordered are listed, but only abnormal results are displayed) Labs Reviewed  BASIC METABOLIC PANEL - Abnormal; Notable for the following components:      Result Value   Glucose, Bld 105 (*)    All other components within normal limits  HEPATIC FUNCTION PANEL - Abnormal; Notable for the following components:   Total Protein 6.3 (*)    All other components within normal limits  CBC  PROTIME-INR  D-DIMER, QUANTITATIVE  LIPASE, BLOOD  TROPONIN I (HIGH SENSITIVITY)  TROPONIN I (HIGH SENSITIVITY)    EKG EKG Interpretation Date/Time:  Saturday August 26 2023 21:41:43 EDT Ventricular Rate:  72 PR Interval:  154 QRS Duration:  78 QT Interval:  386 QTC Calculation: 422 R Axis:   71  Text Interpretation: Normal sinus rhythm Nonspecific ST abnormality Abnormal ECG Confirmed by Tilden Fossa [phone]) on 08/27/2023 5:16:22 AM  Radiology DG Chest 2 View  Result Date: 08/26/2023 CLINICAL DATA:  Chest pain. EXAM: CHEST - 2 VIEW COMPARISON:  10/27/2020. FINDINGS: The heart size and mediastinal contours are within normal limits. No consolidation, effusion, or pneumothorax. Surgical clips are present in the right upper quadrant. There are degenerative changes in the thoracic spine without evidence of acute fracture. IMPRESSION: No active cardiopulmonary disease. Electronically Signed   By: Thornell Sartorius M.D.   On: 08/26/2023 22:18    Procedures Procedures    Medications Ordered in ED Medications - No data to display  ED Course/ Medical Decision Making/ A&P                                 Medical Decision Making Amount and/or Complexity of Data Reviewed Labs: ordered. Radiology:  ordered.   Patient here for evaluation of several days of chest pain when she lays down.  Pain resolves when she exerts herself.  EKG is without acute ischemic changes and troponins are negative x 2.  She is low risk for PE and D-dimer is negative-current clinical picture is not consistent with PE.  No evidence of pneumonia or acute CHF.  Discussed with patient unclear source of her symptoms.  Feel she is stable for discharge home with outpatient follow-up and return precautions.  Of note she was hypertensive when she arrived to the emergency department, blood pressures improved without intervention.  Final Clinical Impression(s) / ED Diagnoses Final diagnoses:  Precordial pain    Rx / DC Orders ED Discharge Orders     None         Tilden Fossa, MD 08/27/23 [phone]

## 2023-08-30 DIAGNOSIS — K219 Gastro-esophageal reflux disease without esophagitis: Secondary | ICD-10-CM | POA: Diagnosis not present

## 2023-08-30 DIAGNOSIS — R634 Abnormal weight loss: Secondary | ICD-10-CM | POA: Diagnosis not present

## 2023-08-30 DIAGNOSIS — R0789 Other chest pain: Secondary | ICD-10-CM | POA: Diagnosis not present

## 2023-09-05 DIAGNOSIS — E785 Hyperlipidemia, unspecified: Secondary | ICD-10-CM | POA: Diagnosis not present

## 2023-09-05 DIAGNOSIS — Z1212 Encounter for screening for malignant neoplasm of rectum: Secondary | ICD-10-CM | POA: Diagnosis not present

## 2023-09-05 DIAGNOSIS — M8580 Other specified disorders of bone density and structure, unspecified site: Secondary | ICD-10-CM | POA: Diagnosis not present

## 2023-09-05 DIAGNOSIS — Z1389 Encounter for screening for other disorder: Secondary | ICD-10-CM | POA: Diagnosis not present

## 2023-09-06 DIAGNOSIS — L905 Scar conditions and fibrosis of skin: Secondary | ICD-10-CM | POA: Diagnosis not present

## 2023-09-06 DIAGNOSIS — Z85828 Personal history of other malignant neoplasm of skin: Secondary | ICD-10-CM | POA: Diagnosis not present

## 2023-09-06 DIAGNOSIS — D485 Neoplasm of uncertain behavior of skin: Secondary | ICD-10-CM | POA: Diagnosis not present

## 2023-09-28 DIAGNOSIS — R82998 Other abnormal findings in urine: Secondary | ICD-10-CM | POA: Diagnosis not present

## 2023-09-28 DIAGNOSIS — F439 Reaction to severe stress, unspecified: Secondary | ICD-10-CM | POA: Diagnosis not present

## 2023-09-28 DIAGNOSIS — N3281 Overactive bladder: Secondary | ICD-10-CM | POA: Diagnosis not present

## 2023-09-28 DIAGNOSIS — Z1331 Encounter for screening for depression: Secondary | ICD-10-CM | POA: Diagnosis not present

## 2023-09-28 DIAGNOSIS — Z23 Encounter for immunization: Secondary | ICD-10-CM | POA: Diagnosis not present

## 2023-09-28 DIAGNOSIS — K219 Gastro-esophageal reflux disease without esophagitis: Secondary | ICD-10-CM | POA: Diagnosis not present

## 2023-09-28 DIAGNOSIS — M8580 Other specified disorders of bone density and structure, unspecified site: Secondary | ICD-10-CM | POA: Diagnosis not present

## 2023-09-28 DIAGNOSIS — M48061 Spinal stenosis, lumbar region without neurogenic claudication: Secondary | ICD-10-CM | POA: Diagnosis not present

## 2023-09-28 DIAGNOSIS — Z1339 Encounter for screening examination for other mental health and behavioral disorders: Secondary | ICD-10-CM | POA: Diagnosis not present

## 2023-09-28 DIAGNOSIS — Z0000 Encounter for general adult medical examination without abnormal findings: Secondary | ICD-10-CM | POA: Diagnosis not present

## 2023-09-28 DIAGNOSIS — E785 Hyperlipidemia, unspecified: Secondary | ICD-10-CM | POA: Diagnosis not present

## 2023-09-28 DIAGNOSIS — R109 Unspecified abdominal pain: Secondary | ICD-10-CM | POA: Diagnosis not present

## 2023-09-28 DIAGNOSIS — M1990 Unspecified osteoarthritis, unspecified site: Secondary | ICD-10-CM | POA: Diagnosis not present

## 2023-09-28 DIAGNOSIS — I739 Peripheral vascular disease, unspecified: Secondary | ICD-10-CM | POA: Diagnosis not present

## 2023-10-03 ENCOUNTER — Ambulatory Visit (HOSPITAL_COMMUNITY)
Admission: RE | Admit: 2023-10-03 | Discharge: 2023-10-03 | Disposition: A | Discharge status: Home / Self Care | Payer: Medicare Other | Source: Ambulatory Visit | Attending: Vascular Surgery | Admitting: Vascular Surgery

## 2023-10-03 ENCOUNTER — Other Ambulatory Visit (HOSPITAL_COMMUNITY): Payer: Self-pay | Admitting: Internal Medicine

## 2023-10-03 DIAGNOSIS — I739 Peripheral vascular disease, unspecified: Secondary | ICD-10-CM

## 2023-10-09 DIAGNOSIS — Z1212 Encounter for screening for malignant neoplasm of rectum: Secondary | ICD-10-CM | POA: Diagnosis not present

## 2023-10-18 DIAGNOSIS — H9193 Unspecified hearing loss, bilateral: Secondary | ICD-10-CM | POA: Diagnosis not present

## 2023-10-18 DIAGNOSIS — H6991 Unspecified Eustachian tube disorder, right ear: Secondary | ICD-10-CM | POA: Diagnosis not present

## 2023-11-06 DIAGNOSIS — Z1231 Encounter for screening mammogram for malignant neoplasm of breast: Secondary | ICD-10-CM | POA: Diagnosis not present

## 2023-12-05 DIAGNOSIS — J302 Other seasonal allergic rhinitis: Secondary | ICD-10-CM | POA: Diagnosis not present

## 2023-12-05 DIAGNOSIS — H9193 Unspecified hearing loss, bilateral: Secondary | ICD-10-CM | POA: Diagnosis not present

## 2023-12-05 DIAGNOSIS — F439 Reaction to severe stress, unspecified: Secondary | ICD-10-CM | POA: Diagnosis not present

## 2023-12-05 DIAGNOSIS — G44209 Tension-type headache, unspecified, not intractable: Secondary | ICD-10-CM | POA: Diagnosis not present

## 2023-12-05 DIAGNOSIS — H6991 Unspecified Eustachian tube disorder, right ear: Secondary | ICD-10-CM | POA: Diagnosis not present

## 2023-12-12 ENCOUNTER — Ambulatory Visit: Payer: Medicare Other | Admitting: Nurse Practitioner

## 2024-01-31 NOTE — Progress Notes (Signed)
 01/31/2024 TARISSA KERIN [id number] [birth year]   Chief Complaint: GERD, abdominal pain   History of Present Illness: Andrea Carroll is a 78 year old female with a past medical history of hyperlipidemia, hiatal hernia, GERD. Past hysterectomy and S/P cholecystectomy 2016. She is known by Dr. Adela Lank.  She presents today with concerns regarding increased reflux symptoms with new onset central abdominal burning pain above above the umbilicus which started 1 month ago and has intermittently awakened her from sleep at night and also occurs randomly during the day.  Eating does not trigger or lessen her abdominal pain.  She experienced central burning abdominal pain while driving to her office appointment today.  No nausea or vomiting.  No fevers or night sweats.  She stated she "freezes" all the time.  Appetite has been decreased.  She has lost 6 to 8 pounds over the past 2 to 3 months.  She also endorsed having intermittent pain throughout her lower abdomen which lasts for less than 5 minutes and is unrelated to her central abdominal pain.  No dysphagia.  She has chronic constipation and describes passing pellet-like stools 2-3 times daily, never feels emptied.  No bloody or black stools.  She does not take a fiber supplement or laxative.  She drinks less than 64 ounces of water daily.  She underwent an EGD 04/07/2021 due to having dysphagia, her esophagus was normal and was empirically dilated and multiple fundic gland gastric polyps were identified.  She underwent a colonoscopy on the same date and two 4 mm sessile serrated polyps were removed from the colon.  She is widowed, husband died 4 years ago.     Latest Ref Rng & Units 08/26/2023    9:51 PM 10/27/2020    2:12 PM 01/09/2015    7:21 AM  CBC  WBC 4.0 - 10.5 K/uL 6.0  5.9  12.1   Hemoglobin 12.0 - 15.0 g/dL 78.4  69.6  29.5   Hematocrit 36.0 - 46.0 % 38.6  41.5  31.3   Platelets 150 - 400 K/uL 276  317  416        Latest Ref Rng &  Units 08/27/2023    6:13 AM 08/26/2023    9:51 PM 12/08/2020    1:04 PM  CMP  Glucose 70 - 99 mg/dL  284  85   BUN 8 - 23 mg/dL  15  13   Creatinine 1.32 - 1.00 mg/dL  4.40  1.02   Sodium 725 - 145 mmol/L  135  140   Potassium 3.5 - 5.1 mmol/L  3.6  4.5   Chloride 98 - 111 mmol/L  98  101   CO2 22 - 32 mmol/L  27  27   Calcium 8.9 - 10.3 mg/dL  9.0  9.0   Total Protein 6.5 - 8.1 g/dL 6.3     Total Bilirubin 0.3 - 1.2 mg/dL 0.5     Alkaline Phos 38 - 126 U/L 57     AST 15 - 41 U/L 16     ALT 0 - 44 U/L 13       PAST GI PROCEDURES:  EGD 04/07/2021: - Esophagogastric landmarks identified.  - Z-line irregular. Biopsied.  - Normal esophagus otherwise  - empiric dilation done to 17mm given symptoms of dysphagia  - Multiple gastric polyps. Suspect benign fundic gland polyps, one representative sample resected and retrieved.  - Normal stomach otherwise  - Duodenal diverticulum.  - Normal duodenum otherwise  Colonoscopy 04/07/2021: - One 4 mm polyp in the cecum, removed with a cold snare. Resected and retrieved.  - One 4 mm polyp in the ascending colon, removed with a cold snare. Resected and retrieved.  - Diverticulosis in the sigmoid colon.  - The examination was otherwise normal. - Consider 5 year recall colonoscopy   1. Surgical [P], gastric, polyp (1) - FUNDIC GLAND POLYP - NEGATIVE FOR DYSPLASIA 2. Surgical [P], GE junction - ESOPHAGEAL SQUAMOUS AND CARDIAC MUCOSA WITH NO SPECIFIC HISTOPATHOLOGIC CHANGES - NEGATIVE FOR INTESTINAL METAPLASIA OR DYSPLASIA 3. Surgical [P], colon, cecum, ascending, polyp (2) - SESSILE SERRATED POLYP(S) WITHOUT CYTOLOGIC DYSPLASIA  Past Medical History:  Diagnosis Date   Allergic rhinitis    Cataract    bialteral   DDD (degenerative disc disease), cervical    also lower back   GERD (gastroesophageal reflux disease)    Hiatal hernia    Hyperlipidemia    diet controlled, no medications   Past Surgical History:  Procedure Laterality Date    ABDOMINAL HYSTERECTOMY     biospy surgery Right    breast   CHOLECYSTECTOMY N/A 01/06/2015   Procedure: LAPAROSCOPIC CHOLECYSTECTOMY WITH INTRAOPERATIVE CHOLANGIOGRAM;  Surgeon: Harriette Bouillon, MD;  Location: [facility] OR;  Service: General;  Laterality: N/A;   COLONOSCOPY  11/2005   [place]   ESOPHAGOGASTRODUODENOSCOPY     Dr Noel Gerold SURGERY Left    Current Outpatient Medications on File Prior to Visit  Medication Sig Dispense Refill   Acetaminophen 325 MG CAPS Take 325 mg by mouth as needed (for pain).     Ascorbic Acid (VITAMIN C) 500 MG CAPS take one tab by mouth once daily     calcium carbonate (OS-CAL) 600 MG TABS tablet Take 600 mg by mouth daily.     Cholecalciferol (VITAMIN D3) 50 MCG (2000 UT) CAPS Take by mouth. Four times daily     famotidine (PEPCID) 20 MG tablet Take 20 mg by mouth daily.     Glucosamine-Chondroit-Vit C-Mn (GLUCOSAMINE 1500 COMPLEX) CAPS Take 1 capsule by mouth daily.      Multiple Vitamin (MULTI VITAMIN DAILY PO) Take 1 tablet by mouth daily.     omeprazole (PRILOSEC) 20 MG capsule Take 1 capsule by mouth once daily 90 capsule 0   traMADol (ULTRAM) 50 MG tablet Take 1-2 tablets (50-100 mg total) by mouth every 6 (six) hours as needed for moderate pain. 30 tablet 0   Turmeric (QC TUMERIC COMPLEX PO) Take 1 tablet by mouth daily. With Ginger     mupirocin ointment (BACTROBAN) 2 % apply to affected area BID (Patient not taking: Reported on 02/21/2022)     nitrofurantoin, macrocrystal-monohydrate, (MACROBID) 100 MG capsule Take 1 capsule by mouth 2 (two) times daily. (Patient not taking: Reported on 04/07/2021)     tolterodine (DETROL LA) 4 MG 24 hr capsule take one cap by mouth twice a week (Patient not taking: Reported on 04/07/2021)     Vitamin D, Ergocalciferol, (DRISDOL) 50000 UNITS CAPS capsule Take 50,000 Units by mouth every other day. friday (Patient not taking: Reported on 02/21/2022)     Zinc 50 MG CAPS Take by mouth daily. (Patient not taking: Reported on  02/01/2024)     Current Facility-Administered Medications on File Prior to Visit  Medication Dose Route Frequency Provider Last Rate Last Admin   lidocaine-prilocaine (EMLA) cream   Topical Once Delories Heinz, DPM       Allergies  Allergen Reactions   Sulfur Rash  Alendronate Sodium     Other Reaction(s): Arthralgia paresthesias   Oseltamivir Hives   Sulfa Antibiotics     rash   Current Medications, Allergies, Past Medical History, Past Surgical History, Family History and Social History were reviewed in [employer] record.  Review of Systems:   Constitutional: See HPI.  Respiratory: Negative for shortness of breath.   Cardiovascular: Negative for chest pain, palpitations and leg swelling.  Gastrointestinal: See HPI.  Musculoskeletal: Negative for back pain or muscle aches.  Neurological: Negative for dizziness, headaches or paresthesias.   Physical Exam: BP 126/78   Pulse 72   Ht 5' 1.5" (1.562 m)   Wt 118 lb 2 oz (53.6 kg)   BMI 21.96 kg/m   General: 78 year old female in no acute distress. Head: Normocephalic and atraumatic. Eyes: No scleral icterus. Conjunctiva pink . Ears: Normal auditory acuity. Mouth: Dentition intact. No ulcers or lesions.  Lungs: Clear throughout to auscultation. Heart: Regular rate and rhythm, no murmur. Abdomen: Soft, nondistended..  Medical tenderness without rebound or guarding.  A high pitch bruit auscultated to he epigastric area to above the umbilicus (patient ate a small breakfast 2 to 3 hours prior to exam). No masses or hepatomegaly. Normal bowel sounds x 4 quadrants.  Rectal: Deferred.  Musculoskeletal: Symmetrical with no gross deformities. Extremities: No edema. Neurological: Alert oriented x 4. No focal deficits.  Psychological: Alert and cooperative. Normal mood and affect  Assessment and Recommendations:  78 year old female with GERD.  EGD 03/2021 showed a normal esophagus and multiple fundic gland  polyps. -Continue Omeprazole 20 mg daily -GERD diet   Central abdominal burning pain which awakens patient from sleep at night and occurs randomly during the day x 1 month. High pitch central upper abdominal bruit on exam -CBC, CMP, CRP -Abd/pelvic CTA -Drink 64 ounces of water daily -Patient to contact office if symptoms worsen and to go to the ED if she develops severe abdominal pain  Constipation with intermittent lower abdominal pain -MiraLAX nightly as needed -TSH -Labs and CTA as ordered above to include CTAP  Decreased appetite, weight loss -Await the above lab and CTAP results -Diet as tolerated  History of colon polyps.  Colonoscopy 03/2021 identified 2 sessile serrated polyps removed from the colon. -Consider colon polyp surveillance colonoscopy 03/2026 if medically appropriate as patient will be 79 at that time.

## 2024-02-01 ENCOUNTER — Other Ambulatory Visit

## 2024-02-01 ENCOUNTER — Encounter: Payer: Self-pay | Admitting: Nurse Practitioner

## 2024-02-01 ENCOUNTER — Ambulatory Visit: Payer: Medicare Other | Admitting: Nurse Practitioner

## 2024-02-01 VITALS — BP 126/78 | HR 72 | Ht 61.5 in | Wt 118.1 lb

## 2024-02-01 DIAGNOSIS — R1033 Periumbilical pain: Secondary | ICD-10-CM

## 2024-02-01 DIAGNOSIS — R634 Abnormal weight loss: Secondary | ICD-10-CM

## 2024-02-01 DIAGNOSIS — Z860101 Personal history of adenomatous and serrated colon polyps: Secondary | ICD-10-CM | POA: Diagnosis not present

## 2024-02-01 DIAGNOSIS — R0989 Other specified symptoms and signs involving the circulatory and respiratory systems: Secondary | ICD-10-CM | POA: Diagnosis not present

## 2024-02-01 DIAGNOSIS — Z8719 Personal history of other diseases of the digestive system: Secondary | ICD-10-CM | POA: Diagnosis not present

## 2024-02-01 DIAGNOSIS — R630 Anorexia: Secondary | ICD-10-CM

## 2024-02-01 DIAGNOSIS — K5900 Constipation, unspecified: Secondary | ICD-10-CM

## 2024-02-01 DIAGNOSIS — R1030 Lower abdominal pain, unspecified: Secondary | ICD-10-CM | POA: Diagnosis not present

## 2024-02-01 DIAGNOSIS — R109 Unspecified abdominal pain: Secondary | ICD-10-CM

## 2024-02-01 DIAGNOSIS — K219 Gastro-esophageal reflux disease without esophagitis: Secondary | ICD-10-CM

## 2024-02-01 LAB — COMPREHENSIVE METABOLIC PANEL
ALT: 12 U/L (ref 0–35)
AST: 17 U/L (ref 0–37)
Albumin: 4.3 g/dL (ref 3.5–5.2)
Alkaline Phosphatase: 67 U/L (ref 39–117)
BUN: 12 mg/dL (ref 6–23)
CO2: 31 meq/L (ref 19–32)
Calcium: 9.6 mg/dL (ref 8.4–10.5)
Chloride: 101 meq/L (ref 96–112)
Creatinine, Ser: 0.65 mg/dL (ref 0.40–1.20)
GFR: 83.71 mL/min (ref >60.00)
Glucose, Bld: 100 mg/dL — ABNORMAL HIGH (ref 70–99)
Potassium: 4.2 meq/L (ref 3.5–5.1)
Sodium: 141 meq/L (ref 135–145)
Total Bilirubin: 0.5 mg/dL (ref 0.2–1.2)
Total Protein: 7.1 g/dL (ref 6.0–8.3)

## 2024-02-01 LAB — CBC WITH DIFFERENTIAL/PLATELET
Basophils Absolute: 0.1 10*3/uL (ref 0.0–0.1)
Basophils Relative: 0.9 % (ref 0.0–3.0)
Eosinophils Absolute: 0 10*3/uL (ref 0.0–0.7)
Eosinophils Relative: 0.5 % (ref 0.0–5.0)
HCT: 41.6 % (ref 36.0–46.0)
Hemoglobin: 13.8 g/dL (ref 12.0–15.0)
Lymphocytes Relative: 22.2 % (ref 12.0–46.0)
Lymphs Abs: 1.3 10*3/uL (ref 0.7–4.0)
MCHC: 33.3 g/dL (ref 30.0–36.0)
MCV: 90.3 fl (ref 78.0–100.0)
Monocytes Absolute: 0.4 10*3/uL (ref 0.1–1.0)
Monocytes Relative: 7.2 % (ref 3.0–12.0)
Neutro Abs: 4.2 10*3/uL (ref 1.4–7.7)
Neutrophils Relative %: 69.2 % (ref 43.0–77.0)
Platelets: 357 10*3/uL (ref 150.0–400.0)
RBC: 4.6 Mil/uL (ref 3.87–5.11)
RDW: 12.7 % (ref 11.5–15.5)
WBC: 6 10*3/uL (ref 4.0–10.5)

## 2024-02-01 LAB — C-REACTIVE PROTEIN: CRP: <1 mg/dL (ref 0.5–20.0)

## 2024-02-01 LAB — TSH: TSH: 2.53 u[IU]/mL (ref 0.35–5.50)

## 2024-02-01 NOTE — Patient Instructions (Addendum)
 Your provider has requested that you go to the basement level for lab work before leaving today. Press "B" on the elevator. The lab is located at the first door on the left as you exit the elevator.   Due to recent changes in healthcare laws, you may see the results of your imaging and laboratory studies on MyChart before your provider has had a chance to review them.  We understand that in some cases there may be results that are confusing or concerning to you. Not all laboratory results come back in the same time frame and the provider may be waiting for multiple results in order to interpret others.  Please give [place] 48 hours in order for your provider to thoroughly review all the results before contacting the office for clarification of your results.    Please purchase the following medications over the counter and take as directed: Miralax, take at night.  Increase water intake to 8 glasses (64 oz) daily.  Go to the ED if you develop some abdominal pain.  You have been scheduled for an CT Angiogram on Tuesday, 02/06/24 at 1:30PM.  Arrive 15 minutes prior to your appointment for registration.  Go to hospital registration for check in.  Thank you for trusting me with your gastrointestinal care!   Alcide Evener, CRNP    _______________________________________________________  If your blood pressure at your visit was 140/90 or greater, please contact your primary care physician to follow up on this.  _______________________________________________________  If you are age 16 or older, your body mass index should be between 23-30. Your Body mass index is 21.96 kg/m. If this is out of the aforementioned range listed, please consider follow up with your Primary Care Provider.  If you are age 23 or younger, your body mass index should be between 19-25. Your Body mass index is 21.96 kg/m. If this is out of the aformentioned range listed, please consider follow up with your Primary Care  Provider.   ________________________________________________________  The [place] [facility] providers would like to encourage you to use [facility] to communicate with providers for non-urgent requests or questions.  Due to long hold times on the telephone, sending your provider a message by [facility] may be a faster and more efficient way to get a response.  Please allow 48 business hours for a response.  Please remember that this is for non-urgent requests.  _______________________________________________________

## 2024-02-02 DIAGNOSIS — R0989 Other specified symptoms and signs involving the circulatory and respiratory systems: Secondary | ICD-10-CM | POA: Insufficient documentation

## 2024-02-02 DIAGNOSIS — R109 Unspecified abdominal pain: Secondary | ICD-10-CM | POA: Insufficient documentation

## 2024-02-02 NOTE — Progress Notes (Signed)
 Agree with assessment and plan as outlined.

## 2024-02-06 ENCOUNTER — Ambulatory Visit (HOSPITAL_COMMUNITY)
Admission: RE | Admit: 2024-02-06 | Discharge: 2024-02-06 | Disposition: A | Discharge status: Home / Self Care | Source: Ambulatory Visit | Attending: Nurse Practitioner | Admitting: Nurse Practitioner

## 2024-02-06 ENCOUNTER — Encounter (HOSPITAL_COMMUNITY): Payer: Self-pay | Admitting: Radiology

## 2024-02-06 DIAGNOSIS — R1033 Periumbilical pain: Secondary | ICD-10-CM | POA: Insufficient documentation

## 2024-02-06 DIAGNOSIS — R0989 Other specified symptoms and signs involving the circulatory and respiratory systems: Secondary | ICD-10-CM | POA: Insufficient documentation

## 2024-02-06 DIAGNOSIS — K5750 Diverticulosis of both small and large intestine without perforation or abscess without bleeding: Secondary | ICD-10-CM | POA: Diagnosis not present

## 2024-02-06 DIAGNOSIS — K5900 Constipation, unspecified: Secondary | ICD-10-CM | POA: Diagnosis not present

## 2024-02-06 MED ORDER — SODIUM CHLORIDE (PF) 0.9 % IJ SOLN
INTRAMUSCULAR | Status: AC
  Filled 2024-02-06: qty 50

## 2024-02-06 MED ORDER — IOHEXOL 350 MG/ML SOLN
100.0000 mL | Freq: Once | INTRAVENOUS | Status: AC | PRN
  Administered 2024-02-06: 100 mL via INTRAVENOUS

## 2024-02-06 MED ORDER — IOHEXOL 300 MG/ML  SOLN: 100.0000 mL | Freq: Once | INTRAMUSCULAR | Status: DC | PRN

## 2024-02-12 DIAGNOSIS — E785 Hyperlipidemia, unspecified: Secondary | ICD-10-CM | POA: Diagnosis not present

## 2024-02-12 DIAGNOSIS — R6889 Other general symptoms and signs: Secondary | ICD-10-CM | POA: Diagnosis not present

## 2024-02-12 DIAGNOSIS — N3281 Overactive bladder: Secondary | ICD-10-CM | POA: Diagnosis not present

## 2024-02-12 DIAGNOSIS — M1990 Unspecified osteoarthritis, unspecified site: Secondary | ICD-10-CM | POA: Diagnosis not present

## 2024-02-12 DIAGNOSIS — M8580 Other specified disorders of bone density and structure, unspecified site: Secondary | ICD-10-CM | POA: Diagnosis not present

## 2024-02-12 DIAGNOSIS — I739 Peripheral vascular disease, unspecified: Secondary | ICD-10-CM | POA: Diagnosis not present

## 2024-02-12 DIAGNOSIS — R109 Unspecified abdominal pain: Secondary | ICD-10-CM | POA: Diagnosis not present

## 2024-02-12 DIAGNOSIS — K219 Gastro-esophageal reflux disease without esophagitis: Secondary | ICD-10-CM | POA: Diagnosis not present

## 2024-02-12 DIAGNOSIS — M48061 Spinal stenosis, lumbar region without neurogenic claudication: Secondary | ICD-10-CM | POA: Diagnosis not present

## 2024-02-12 DIAGNOSIS — F439 Reaction to severe stress, unspecified: Secondary | ICD-10-CM | POA: Diagnosis not present

## 2024-05-16 DIAGNOSIS — M25552 Pain in left hip: Secondary | ICD-10-CM | POA: Diagnosis not present

## 2024-07-24 DIAGNOSIS — L821 Other seborrheic keratosis: Secondary | ICD-10-CM | POA: Diagnosis not present

## 2024-07-24 DIAGNOSIS — C4441 Basal cell carcinoma of skin of scalp and neck: Secondary | ICD-10-CM | POA: Diagnosis not present

## 2024-07-24 DIAGNOSIS — L570 Actinic keratosis: Secondary | ICD-10-CM | POA: Diagnosis not present

## 2024-07-24 DIAGNOSIS — D485 Neoplasm of uncertain behavior of skin: Secondary | ICD-10-CM | POA: Diagnosis not present

## 2024-07-24 DIAGNOSIS — D2262 Melanocytic nevi of left upper limb, including shoulder: Secondary | ICD-10-CM | POA: Diagnosis not present

## 2024-07-24 DIAGNOSIS — Z85828 Personal history of other malignant neoplasm of skin: Secondary | ICD-10-CM | POA: Diagnosis not present

## 2024-07-24 DIAGNOSIS — L814 Other melanin hyperpigmentation: Secondary | ICD-10-CM | POA: Diagnosis not present

## 2024-07-24 DIAGNOSIS — D225 Melanocytic nevi of trunk: Secondary | ICD-10-CM | POA: Diagnosis not present

## 2024-07-24 DIAGNOSIS — L905 Scar conditions and fibrosis of skin: Secondary | ICD-10-CM | POA: Diagnosis not present

## 2024-07-24 DIAGNOSIS — D2261 Melanocytic nevi of right upper limb, including shoulder: Secondary | ICD-10-CM | POA: Diagnosis not present

## 2024-09-12 DIAGNOSIS — C4442 Squamous cell carcinoma of skin of scalp and neck: Secondary | ICD-10-CM | POA: Diagnosis not present

## 2024-09-12 DIAGNOSIS — L988 Other specified disorders of the skin and subcutaneous tissue: Secondary | ICD-10-CM | POA: Diagnosis not present

## 2024-09-12 DIAGNOSIS — Z85828 Personal history of other malignant neoplasm of skin: Secondary | ICD-10-CM | POA: Diagnosis not present

## 2024-10-07 DIAGNOSIS — E785 Hyperlipidemia, unspecified: Secondary | ICD-10-CM | POA: Diagnosis not present

## 2024-10-14 DIAGNOSIS — R82998 Other abnormal findings in urine: Secondary | ICD-10-CM | POA: Diagnosis not present

## 2024-10-14 DIAGNOSIS — R413 Other amnesia: Secondary | ICD-10-CM | POA: Diagnosis not present

## 2024-11-07 DIAGNOSIS — Z1231 Encounter for screening mammogram for malignant neoplasm of breast: Secondary | ICD-10-CM | POA: Diagnosis not present
